# Patient Record
Sex: Male | Born: 1970 | Race: White | Hispanic: No | Smoking: Former smoker
Health system: Southern US, Community
[De-identification: ages and names within clinical notes are randomized; demographics above are authoritative.]

## PROBLEM LIST (undated history)

## (undated) DIAGNOSIS — R51 Headache: Secondary | ICD-10-CM

## (undated) DIAGNOSIS — I219 Acute myocardial infarction, unspecified: Secondary | ICD-10-CM

## (undated) DIAGNOSIS — T8859XA Other complications of anesthesia, initial encounter: Secondary | ICD-10-CM

## (undated) DIAGNOSIS — K219 Gastro-esophageal reflux disease without esophagitis: Secondary | ICD-10-CM

## (undated) DIAGNOSIS — T4145XA Adverse effect of unspecified anesthetic, initial encounter: Secondary | ICD-10-CM

## (undated) DIAGNOSIS — R519 Headache, unspecified: Secondary | ICD-10-CM

## (undated) DIAGNOSIS — J45909 Unspecified asthma, uncomplicated: Secondary | ICD-10-CM

## (undated) DIAGNOSIS — Z8719 Personal history of other diseases of the digestive system: Secondary | ICD-10-CM

## (undated) DIAGNOSIS — Z87442 Personal history of urinary calculi: Secondary | ICD-10-CM

## (undated) HISTORY — PX: CARPAL TUNNEL RELEASE: SHX101

## (undated) HISTORY — PX: CHOLECYSTECTOMY: SHX55

## (undated) HISTORY — PX: APPENDECTOMY: SHX54

## (undated) HISTORY — PX: KNEE ARTHROSCOPY: SHX127

## (undated) HISTORY — PX: ULNAR NERVE REPAIR: SHX2594

---

## 2010-10-17 HISTORY — PX: CARDIAC CATHETERIZATION: SHX172

## 2018-04-26 ENCOUNTER — Other Ambulatory Visit: Payer: Self-pay | Admitting: Neurological Surgery

## 2018-04-27 ENCOUNTER — Other Ambulatory Visit: Payer: Self-pay | Admitting: *Deleted

## 2018-05-03 ENCOUNTER — Ambulatory Visit (INDEPENDENT_AMBULATORY_CARE_PROVIDER_SITE_OTHER): Payer: Worker's Compensation | Admitting: Vascular Surgery

## 2018-05-03 ENCOUNTER — Ambulatory Visit: Payer: Self-pay | Admitting: Vascular Surgery

## 2018-05-03 ENCOUNTER — Encounter: Payer: Self-pay | Admitting: Vascular Surgery

## 2018-05-03 ENCOUNTER — Other Ambulatory Visit: Payer: Self-pay

## 2018-05-03 VITALS — BP 146/98 | HR 85 | Temp 98.1°F | Resp 16 | Ht 68.0 in | Wt 236.0 lb

## 2018-05-03 DIAGNOSIS — M5137 Other intervertebral disc degeneration, lumbosacral region: Secondary | ICD-10-CM | POA: Diagnosis not present

## 2018-05-03 NOTE — Progress Notes (Signed)
Vascular and Vein Specialist of [facility]  Patient name: Cleaven Demario MRN: [id number] DOB: Aug 04, 1971 Sex: male  REASON FOR CONSULT: Discuss anterior exposure for L5-S1 disc surgery  HPI: Rafe Mackowski is a 47 y.o. male, who is today to discuss anterior exposure for L5-S1 disc surgery plan for 06/06/2018 with Dr. Marikay Alar.  He reports that he slipped at work on September 2018 and has had severe pain in his back and left leg since this time.  He is failed physical therapy and injection therapy.  Has been recommended to undergo anterior approach for fusion.  He has had no prior back surgery.  He did have laparoscopic cholecystectomy and also had surgery for ruptured appendix requiring prolonged drainage.  No other intra-abdominal surgeries.  His medical history is significant for myocardial infarction with stenting in 2012.  Has been stable with no cardiac issues since that time  Family history: No premature cardiac difficulty.  SOCIAL HISTORY: Social History   Socioeconomic History  . Marital status: Divorced    Spouse name: Not on file  . Number of children: Not on file  . Years of education: Not on file  . Highest education level: Not on file  Occupational History  . Not on file  Social Needs  . Financial resource strain: Not on file  . Food insecurity:    Worry: Not on file    Inability: Not on file  . Transportation needs:    Medical: Not on file    Non-medical: Not on file  Tobacco Use  . Smoking status: Former Smoker    Types: Cigarettes    Last attempt to quit: 1993    Years since quitting: 26.5  . Smokeless tobacco: Former Engineer, water and Sexual Activity  . Alcohol use: Not on file  . Drug use: Not on file  . Sexual activity: Not on file  Lifestyle  . Physical activity:    Days per week: Not on file    Minutes per session: Not on file  . Stress: Not on file  Relationships  . Social connections:    Talks on  phone: Not on file    Gets together: Not on file    Attends religious service: Not on file    Active member of club or organization: Not on file    Attends meetings of clubs or organizations: Not on file    Relationship status: Not on file  . Intimate partner violence:    Fear of current or ex partner: Not on file    Emotionally abused: Not on file    Physically abused: Not on file    Forced sexual activity: Not on file  Other Topics Concern  . Not on file  Social History Narrative  . Not on file    Allergies  Allergen Reactions  . Latex     Powder in the gloves   . Naproxen     Current Outpatient Medications  Medication Sig Dispense Refill  . 40981 cholestyramine (with sugar) 4 gram oral powder    . acetaminophen (TYLENOL) 500 MG tablet Take 500 mg by mouth every 6 (six) hours as needed.    Marland Kitchen albuterol (PROAIR HFA) 108 (90 Base) MCG/ACT inhaler inhale 2 puff by inhalation route  every 4 - 6 hours as needed    . albuterol (PROVENTIL HFA;VENTOLIN HFA) 108 (90 Base) MCG/ACT inhaler     . cholestyramine (QUESTRAN) 4 GM/DOSE powder     . diclofenac sodium (  VOLTAREN) 1 % GEL Apply topically 4 (four) times daily.    Marland Kitchen. loratadine-pseudoephedrine (CLARITIN-D 12 HOUR) 5-120 MG tablet Take by mouth.    Marland Kitchen. omeprazole (PRILOSEC) 40 MG capsule omeprazole 40 mg capsule,delayed release  TK 1 C PO D    . rizatriptan (MAXALT) 10 MG tablet Take 1 tab for migraine; may repeat in 2 hours     No current facility-administered medications for this visit.     REVIEW OF SYSTEMS:  [X]  denotes positive finding, [ ]  denotes negative finding Cardiac  Comments:  Chest pain or chest pressure: x   Shortness of breath upon exertion: x   Short of breath when lying flat:    Irregular heart rhythm: x       Vascular    Pain in calf, thigh, or hip brought on by ambulation:    Pain in feet at night that wakes you up from your sleep:     Blood clot in your veins:    Leg swelling:         Pulmonary      Oxygen at home:    Productive cough:     Wheezing:         Neurologic    Sudden weakness in arms or legs:     Sudden numbness in arms or legs:     Sudden onset of difficulty speaking or slurred speech:    Temporary loss of vision in one eye:     Problems with dizziness:         Gastrointestinal    Blood in stool:     Vomited blood:         Genitourinary    Burning when urinating:     Blood in urine:        Psychiatric    Major depression:         Hematologic    Bleeding problems:    Problems with blood clotting too easily:        Skin    Rashes or ulcers:        Constitutional    Fever or chills:      PHYSICAL EXAM: Vitals:   05/03/18 0926  BP: (!) 146/98  Pulse: 85  Resp: 16  Temp: 98.1 F (36.7 C)  TempSrc: Oral  SpO2: 97%  Weight: 236 lb (107 kg)  Height: 5\' 8"  (1.727 m)    GENERAL: The patient is a well-nourished male, in no acute distress. The vital signs are documented above. CARDIOVASCULAR: Carotid arteries without bruits bilaterally.  2+ radial and 2+ dorsalis pedis pulses bilaterally PULMONARY: There is good air exchange  ABDOMEN: Soft and non-tender  MUSCULOSKELETAL: There are no major deformities or cyanosis. NEUROLOGIC: No focal weakness or paresthesias are detected. SKIN: There are no ulcers or rashes noted. PSYCHIATRIC: The patient has a normal affect.  DATA:  None  MEDICAL ISSUES: Had long discussion with the patient and his girlfriend present.  Explained my role for exposure.  Explained the mobilization of the rectus muscle, intraperitoneal contents, left ureter and arterial and venous structures overlying the spine.  Discussed attentional injury to all of these particularly discussed venous injury.  Also discussed the potential for retrograde ejaculation.  He is not morbidly obese.  Has no dense of peripheral vascular occlusive disease and is had not had extensive pelvic surgery.  I do not feel that he is at any prohibitive risk for  spine exposure.  Plan to proceed after cardiac clearance on 06/06/2018  Rosetta Posner, MD FACS Vascular and Vein Specialists of [facility] Tel [phone] Pager [phone]

## 2018-05-28 NOTE — Pre-Procedure Instructions (Signed)
Gad Burgin Ficek  05/28/2018      [id number] - [place], [place] - [address] [facility] [address] [place] [id number] Phone: [phone] Fax: [phone]    Your procedure is scheduled on June 06, 2018.  [facility] Admitting at 630 AM.  Call this number if you have problems the morning of surgery:  [phone]   Remember:  Do not eat or drink after midnight.   Take these medicines the morning of surgery with A SIP OF WATER  Tylenol-if needed Albuterol inhaler-if needed (bring inhaler with you) Xalatan eye drops Omeprazole (prilosec) Refesh eye drops-if needed  7 days prior to surgery STOP taking any diclofenac (voltaren) gel, Aspirin (unless otherwise instructed by your surgeon), Aleve, Naproxen, Ibuprofen, Motrin, Advil, Goody's, BC's, all herbal medications, fish oil, and all vitamins   Do not wear jewelry  Do not wear lotions, powders, or colognes, or deodorant.  Men may shave face and neck.  Do not bring valuables to the hospital.  [facility] is not responsible for any belongings or valuables.  Contacts, dentures or bridgework may not be worn into surgery.  Leave your suitcase in the car.  After surgery it may be brought to your room.  For patients admitted to the hospital, discharge time will be determined by your treatment team.  Patients discharged the day of surgery will not be allowed to drive home.    [facility]- Preparing For Surgery  Before surgery, you can play an important role. Because skin is not sterile, your skin needs to be as free of germs as possible. You can reduce the number of germs on your skin by washing with CHG (chlorahexidine gluconate) Soap before surgery.  CHG is an antiseptic cleaner which kills germs and bonds with the skin to continue killing germs even after washing.    Oral Hygiene is also important to reduce your risk of infection.  Remember - BRUSH YOUR TEETH  THE MORNING OF SURGERY WITH YOUR REGULAR TOOTHPASTE  Please do not use if you have an allergy to CHG or antibacterial soaps. If your skin becomes reddened/irritated stop using the CHG.  Do not shave (including legs and underarms) for at least 48 hours prior to first CHG shower. It is OK to shave your face.  Please follow these instructions carefully.   1. Shower the NIGHT BEFORE SURGERY and the MORNING OF SURGERY with CHG.   2. If you chose to wash your hair, wash your hair first as usual with your normal shampoo.  3. After you shampoo, rinse your hair and body thoroughly to remove the shampoo.  4. Use CHG as you would any other liquid soap. You can apply CHG directly to the skin and wash gently with a scrungie or a clean washcloth.   5. Apply the CHG Soap to your body ONLY FROM THE NECK DOWN.  Do not use on open wounds or open sores. Avoid contact with your eyes, ears, mouth and genitals (private parts). Wash Face and genitals (private parts)  with your normal soap.  6. Wash thoroughly, paying special attention to the area where your surgery will be performed.  7. Thoroughly rinse your body with warm water from the neck down.  8. DO NOT shower/wash with your normal soap after using and rinsing off the CHG Soap.  9. Pat yourself dry with a CLEAN TOWEL.  10. Wear  CLEAN PAJAMAS to bed the night before surgery, wear comfortable clothes the morning of surgery  11. Place CLEAN SHEETS on your bed the night of your first shower and DO NOT SLEEP WITH PETS.  Day of Surgery:  Do not apply any deodorants/lotions.  Please wear clean clothes to the hospital/surgery center.   Remember to brush your teeth WITH YOUR REGULAR TOOTHPASTE.  Please read over the following fact sheets that you were given. Pain Booklet, Coughing and Deep Breathing, MRSA Information and Surgical Site Infection Prevention

## 2018-05-29 ENCOUNTER — Encounter (HOSPITAL_COMMUNITY): Payer: Self-pay

## 2018-05-29 ENCOUNTER — Other Ambulatory Visit: Payer: Self-pay

## 2018-05-29 ENCOUNTER — Ambulatory Visit (HOSPITAL_COMMUNITY)
Admission: RE | Admit: 2018-05-29 | Discharge: 2018-05-29 | Disposition: A | Discharge status: Home / Self Care | Payer: No Typology Code available for payment source | Source: Ambulatory Visit | Attending: Neurological Surgery | Admitting: Neurological Surgery

## 2018-05-29 ENCOUNTER — Encounter (HOSPITAL_COMMUNITY)
Admission: RE | Admit: 2018-05-29 | Discharge: 2018-05-29 | Disposition: A | Discharge status: Home / Self Care | Payer: No Typology Code available for payment source | Source: Ambulatory Visit | Attending: Neurological Surgery | Admitting: Neurological Surgery

## 2018-05-29 DIAGNOSIS — Z01812 Encounter for preprocedural laboratory examination: Secondary | ICD-10-CM | POA: Insufficient documentation

## 2018-05-29 DIAGNOSIS — M4310 Spondylolisthesis, site unspecified: Secondary | ICD-10-CM

## 2018-05-29 DIAGNOSIS — Z01818 Encounter for other preprocedural examination: Secondary | ICD-10-CM | POA: Insufficient documentation

## 2018-05-29 HISTORY — DX: Adverse effect of unspecified anesthetic, initial encounter: T41.45XA

## 2018-05-29 HISTORY — DX: Acute myocardial infarction, unspecified: I21.9

## 2018-05-29 HISTORY — DX: Gastro-esophageal reflux disease without esophagitis: K21.9

## 2018-05-29 HISTORY — DX: Headache: R51

## 2018-05-29 HISTORY — DX: Unspecified asthma, uncomplicated: J45.909

## 2018-05-29 HISTORY — DX: Personal history of other diseases of the digestive system: Z87.19

## 2018-05-29 HISTORY — DX: Headache, unspecified: R51.9

## 2018-05-29 HISTORY — DX: Other complications of anesthesia, initial encounter: T88.59XA

## 2018-05-29 HISTORY — DX: Personal history of urinary calculi: Z87.442

## 2018-05-29 LAB — CBC WITH DIFFERENTIAL/PLATELET
Abs Immature Granulocytes: 0 10*3/uL (ref 0.0–0.1)
BASOS ABS: 0 10*3/uL (ref 0.0–0.1)
BASOS PCT: 0 %
Eosinophils Absolute: 0.1 10*3/uL (ref 0.0–0.7)
Eosinophils Relative: 2 %
HCT: 46.6 % (ref 39.0–52.0)
HEMOGLOBIN: 15.9 g/dL (ref 13.0–17.0)
Immature Granulocytes: 1 %
Lymphocytes Relative: 28 %
Lymphs Abs: 1.7 10*3/uL (ref 0.7–4.0)
MCH: 29.8 pg (ref 26.0–34.0)
MCHC: 34.1 g/dL (ref 30.0–36.0)
MCV: 87.4 fL (ref 78.0–100.0)
Monocytes Absolute: 0.5 10*3/uL (ref 0.1–1.0)
Monocytes Relative: 8 %
NEUTROS ABS: 3.6 10*3/uL (ref 1.7–7.7)
NEUTROS PCT: 61 %
PLATELETS: 169 10*3/uL (ref 150–400)
RBC: 5.33 MIL/uL (ref 4.22–5.81)
RDW: 13.1 % (ref 11.5–15.5)
WBC: 5.9 10*3/uL (ref 4.0–10.5)

## 2018-05-29 LAB — BASIC METABOLIC PANEL
ANION GAP: 11 (ref 5–15)
BUN: 13 mg/dL (ref 6–20)
CALCIUM: 9 mg/dL (ref 8.9–10.3)
CO2: 23 mmol/L (ref 22–32)
Chloride: 106 mmol/L (ref 98–111)
Creatinine, Ser: 1.33 mg/dL — ABNORMAL HIGH (ref 0.61–1.24)
Glucose, Bld: 83 mg/dL (ref 70–99)
Potassium: 4.2 mmol/L (ref 3.5–5.1)
SODIUM: 140 mmol/L (ref 135–145)

## 2018-05-29 LAB — TYPE AND SCREEN
ABO/RH(D): O NEG
Antibody Screen: NEGATIVE

## 2018-05-29 LAB — SURGICAL PCR SCREEN
MRSA, PCR: NEGATIVE
STAPHYLOCOCCUS AUREUS: NEGATIVE

## 2018-05-29 LAB — PROTIME-INR
INR: 1.09
PROTHROMBIN TIME: 14 s (ref 11.4–15.2)

## 2018-05-29 LAB — ABO/RH: ABO/RH(D): O NEG

## 2018-05-29 NOTE — Progress Notes (Signed)
PCP: Gardner CandleMartin Bradshaw PA @ [facility] in [place], [place] Cardiologist: Dr. Vivien PrestoAnastacio @ [facility] in [place], [place]

## 2018-05-30 NOTE — Progress Notes (Signed)
Anesthesia Chart Review:  Case:  [id number] Date/Time:  06/06/18 0815   Procedures:      ALIF - L5-S1 (N/A )     ABDOMINAL EXPOSURE (N/A )   Anesthesia type:  General   Pre-op diagnosis:  Spondylolisthesis   Location:  [facility] OR ROOM 19 / [facility] OR   Surgeon:  Tia AlertJones, David S, MD; Larina EarthlyEarly, Todd F, MD      DISCUSSION: 47 yo male former smoker for above procedure. Pertinent hx includes Asthma (exercise induced), GERD, hiatal hernia.  Listed in his history is a diagnosis of myocardial infarction. Review of cardiology records from [facility] does not indicate the pt had an MI.   He was seen by cardiology Dr. Ronnette HilaIra Friedlander on 05/04/2018 for preop clearance. Per his notes the pt was previously diagnosed with atypical chest pain and dyspnea, noncardiac in etiology.  He had a cardiac catheterization performed by Dr. Hulan Saasobert Haber on 07/26/2011 showing essentially normal coronary arteries and normal left ventricular systolic function with an ejection fraction of 60%.  Evidently the catheterization was prompted by a false positive nuclear graded exercise test.   Per Dr. Marletta LorFriedlander's clearance note "His normal heart cath in 2012 and the absence of any cardiac symptoms despite exertion greater than 4 METS places him at usual anesthetic and operative risk."  Anticipate he can proceed with surgery as planned barring acute status change.  VS: BP (!) 163/99   Pulse 83   Temp 36.8 C   Resp 20   Ht 5\' 8"  (1.727 m)   Wt 107.9 kg   SpO2 98%   BMI 36.16 kg/m   PROVIDERS: Shade FloodBradshaw, J Martin, PA-C is PCP  Ronnette HilaFriedlander, Ira, MD is Cardiologist last seen 05/04/2018 for preop clearance  LABS: Labs reviewed: Acceptable for surgery. (all labs ordered are listed, but only abnormal results are displayed)  Labs Reviewed  BASIC METABOLIC PANEL - Abnormal; Notable for the following components:      Result Value   Creatinine, Ser 1.33 (*)    All other components within normal limits  SURGICAL PCR SCREEN  CBC WITH  DIFFERENTIAL/PLATELET  PROTIME-INR  TYPE AND SCREEN  ABO/RH     IMAGES: CHEST - 2 VIEW 05/29/2018  COMPARISON:  No prior.  FINDINGS: Mediastinum and hilar structures normal. Lungs are clear. No pleural effusion pneumothorax. Heart size normal with normal pulmonary vascularity. No acute bony abnormality. Surgical clips right upper quadrant.  IMPRESSION: No acute cardiopulmonary disease.   EKG: 05/04/2018 (outside record, copy in pt chart): Sinus rhythm.  Left axis deviation.  Nonspecific T wave abnormality.   CV: Per notes from [facility] (in patient's chart) the patient had a cardiac catheterization performed by Dr. Clyde Canterburyobert Huber on 07/26/2011 showing essentially normal coronary arteries and left ventricular systolic function with an ejection fraction of 60%.  Past Medical History:  Diagnosis Date  . Asthma    excerise induced  . Complication of anesthesia    takes a lot to go under anesthesia  . GERD (gastroesophageal reflux disease)   . Headache   . History of hiatal hernia   . History of kidney stones   . Myocardial infarction [facility](HCC)     Past Surgical History:  Procedure Laterality Date  . APPENDECTOMY    . CARDIAC CATHETERIZATION  2012  . CARPAL TUNNEL RELEASE Right   . CHOLECYSTECTOMY    . KNEE ARTHROSCOPY Left   . ULNAR NERVE REPAIR Left     MEDICATIONS: . acetaminophen (TYLENOL) 500 MG tablet  .  albuterol (PROVENTIL HFA;VENTOLIN HFA) 108 (90 Base) MCG/ACT inhaler  . cholestyramine light (PREVALITE) 4 GM/DOSE powder  . diclofenac sodium (VOLTAREN) 1 % GEL  . latanoprost (XALATAN) 0.005 % ophthalmic solution  . loratadine-pseudoephedrine (CLARITIN-D 12 HOUR) 5-120 MG tablet  . Multiple Vitamins-Minerals (ICAPS PO)  . omeprazole (PRILOSEC) 40 MG capsule  . Polyvinyl Alcohol-Povidone (REFRESH OP)  . rizatriptan (MAXALT) 10 MG tablet   No current facility-administered medications for this encounter.     Zannie CoveJames Matayah Reyburn, PA-C [facility]  [facility]/Anesthesiology Phone [phone] 05/30/2018 2:38 PM

## 2018-06-05 NOTE — Anesthesia Preprocedure Evaluation (Addendum)
Anesthesia Evaluation  Patient identified by MRN, date of birth, ID band Patient awake    Reviewed: Allergy & Precautions, NPO status , Patient's Chart, lab work & pertinent test results  History of Anesthesia Complications Negative for: history of anesthetic complications  Airway Mallampati: II  TM Distance: >3 FB Neck ROM: Full    Dental no notable dental hx. (+) Dental Advisory Given   Pulmonary asthma , former smoker,    Pulmonary exam normal        Cardiovascular negative cardio ROS Normal cardiovascular exam  He had a cardiac catheterization performed by Dr. Hulan Saasobert Haber on 07/26/2011 showing essentially normal coronary arteries and normal left ventricular systolic function with an ejection fraction of 60%.  Evidently the catheterization was prompted by a false positive nuclear graded exercise test.    Neuro/Psych negative neurological ROS  negative psych ROS   GI/Hepatic Neg liver ROS, hiatal hernia, GERD  ,  Endo/Other  Morbid obesity  Renal/GU negative Renal ROS     Musculoskeletal negative musculoskeletal ROS (+)   Abdominal   Peds  Hematology negative hematology ROS (+)   Anesthesia Other Findings Day of surgery medications reviewed with the patient.  Reproductive/Obstetrics                            Anesthesia Physical Anesthesia Plan  ASA: II  Anesthesia Plan: General   Post-op Pain Management:    Induction: Intravenous  PONV Risk Score and Plan: 3 and Ondansetron, Dexamethasone and Scopolamine patch - Pre-op  Airway Management Planned: Oral ETT  Additional Equipment:   Intra-op Plan:   Post-operative Plan: Extubation in OR  Informed Consent: I have reviewed the patients History and Physical, chart, labs and discussed the procedure including the risks, benefits and alternatives for the proposed anesthesia with the patient or authorized representative who has  indicated his/her understanding and acceptance.   Dental advisory given  Plan Discussed with: CRNA and Anesthesiologist  Anesthesia Plan Comments:        Anesthesia Quick Evaluation

## 2018-06-06 ENCOUNTER — Inpatient Hospital Stay (HOSPITAL_COMMUNITY): Payer: No Typology Code available for payment source

## 2018-06-06 ENCOUNTER — Other Ambulatory Visit: Payer: Self-pay

## 2018-06-06 ENCOUNTER — Inpatient Hospital Stay (HOSPITAL_COMMUNITY): Payer: No Typology Code available for payment source | Admitting: Anesthesiology

## 2018-06-06 ENCOUNTER — Inpatient Hospital Stay (HOSPITAL_COMMUNITY)
Admission: RE | Disposition: A | Discharge status: Home / Self Care | Payer: Self-pay | Source: Ambulatory Visit | Attending: Neurological Surgery

## 2018-06-06 ENCOUNTER — Inpatient Hospital Stay (HOSPITAL_COMMUNITY)
Admission: RE | Admit: 2018-06-06 | Discharge: 2018-06-07 | DRG: 460 | Disposition: A | Discharge status: Home / Self Care | Payer: No Typology Code available for payment source | Source: Ambulatory Visit | Attending: Neurological Surgery | Admitting: Neurological Surgery

## 2018-06-06 ENCOUNTER — Encounter (HOSPITAL_COMMUNITY): Payer: Self-pay | Admitting: Urology

## 2018-06-06 ENCOUNTER — Inpatient Hospital Stay (HOSPITAL_COMMUNITY): Payer: No Typology Code available for payment source | Admitting: Vascular Surgery

## 2018-06-06 DIAGNOSIS — M5137 Other intervertebral disc degeneration, lumbosacral region: Secondary | ICD-10-CM | POA: Diagnosis present

## 2018-06-06 DIAGNOSIS — K219 Gastro-esophageal reflux disease without esophagitis: Secondary | ICD-10-CM | POA: Diagnosis present

## 2018-06-06 DIAGNOSIS — Z419 Encounter for procedure for purposes other than remedying health state, unspecified: Secondary | ICD-10-CM

## 2018-06-06 DIAGNOSIS — M4317 Spondylolisthesis, lumbosacral region: Secondary | ICD-10-CM | POA: Diagnosis present

## 2018-06-06 DIAGNOSIS — I252 Old myocardial infarction: Secondary | ICD-10-CM

## 2018-06-06 DIAGNOSIS — Z87891 Personal history of nicotine dependence: Secondary | ICD-10-CM | POA: Diagnosis not present

## 2018-06-06 DIAGNOSIS — Z981 Arthrodesis status: Secondary | ICD-10-CM

## 2018-06-06 DIAGNOSIS — J45909 Unspecified asthma, uncomplicated: Secondary | ICD-10-CM | POA: Diagnosis present

## 2018-06-06 DIAGNOSIS — M79605 Pain in left leg: Secondary | ICD-10-CM | POA: Diagnosis present

## 2018-06-06 HISTORY — PX: ANTERIOR LUMBAR FUSION: SHX1170

## 2018-06-06 HISTORY — PX: ABDOMINAL EXPOSURE: SHX5708

## 2018-06-06 SURGERY — ANTERIOR LUMBAR FUSION 1 LEVEL
Anesthesia: General

## 2018-06-06 MED ORDER — LATANOPROST 0.005 % OP SOLN
1.0000 [drp] | Freq: Every day | OPHTHALMIC | Status: DC
  Filled 2018-06-06: qty 2.5

## 2018-06-06 MED ORDER — LACTATED RINGERS IV SOLN
INTRAVENOUS | Status: DC | PRN
  Administered 2018-06-06 (×2): via INTRAVENOUS

## 2018-06-06 MED ORDER — 0.9 % SODIUM CHLORIDE (POUR BTL) OPTIME
TOPICAL | Status: DC | PRN
  Administered 2018-06-06 (×2): 1000 mL

## 2018-06-06 MED ORDER — ONDANSETRON HCL 4 MG/2ML IJ SOLN: 4.0000 mg | Freq: Four times a day (QID) | INTRAMUSCULAR | Status: DC | PRN

## 2018-06-06 MED ORDER — ROCURONIUM BROMIDE 50 MG/5ML IV SOSY
PREFILLED_SYRINGE | INTRAVENOUS | Status: AC
  Filled 2018-06-06: qty 10

## 2018-06-06 MED ORDER — HYDROMORPHONE HCL 1 MG/ML IJ SOLN
0.2500 mg | INTRAMUSCULAR | Status: DC | PRN
  Administered 2018-06-06 (×2): 0.5 mg via INTRAVENOUS

## 2018-06-06 MED ORDER — KETAMINE HCL 10 MG/ML IJ SOLN
INTRAMUSCULAR | Status: DC | PRN
  Administered 2018-06-06 (×3): 10 mg via INTRAVENOUS

## 2018-06-06 MED ORDER — SCOPOLAMINE 1 MG/3DAYS TD PT72
1.0000 | MEDICATED_PATCH | TRANSDERMAL | Status: DC
  Administered 2018-06-06: 1.5 mg via TRANSDERMAL

## 2018-06-06 MED ORDER — CHLORHEXIDINE GLUCONATE CLOTH 2 % EX PADS: 6.0000 | MEDICATED_PAD | Freq: Once | CUTANEOUS | Status: DC

## 2018-06-06 MED ORDER — DEXAMETHASONE SODIUM PHOSPHATE 10 MG/ML IJ SOLN
INTRAMUSCULAR | Status: DC | PRN
  Administered 2018-06-06: 10 mg via INTRAVENOUS

## 2018-06-06 MED ORDER — ROCURONIUM BROMIDE 10 MG/ML (PF) SYRINGE
PREFILLED_SYRINGE | INTRAVENOUS | Status: DC | PRN
  Administered 2018-06-06: 20 mg via INTRAVENOUS
  Administered 2018-06-06: 10 mg via INTRAVENOUS
  Administered 2018-06-06: 50 mg via INTRAVENOUS
  Administered 2018-06-06: 20 mg via INTRAVENOUS

## 2018-06-06 MED ORDER — PROPOFOL 10 MG/ML IV BOLUS
INTRAVENOUS | Status: DC | PRN
  Administered 2018-06-06: 200 mg via INTRAVENOUS

## 2018-06-06 MED ORDER — METHOCARBAMOL 1000 MG/10ML IJ SOLN
500.0000 mg | Freq: Four times a day (QID) | INTRAVENOUS | Status: DC | PRN
  Filled 2018-06-06: qty 5

## 2018-06-06 MED ORDER — SCOPOLAMINE 1 MG/3DAYS TD PT72
MEDICATED_PATCH | TRANSDERMAL | Status: AC
  Administered 2018-06-06: 1.5 mg via TRANSDERMAL
  Filled 2018-06-06: qty 1

## 2018-06-06 MED ORDER — ONDANSETRON HCL 4 MG/2ML IJ SOLN
INTRAMUSCULAR | Status: AC
  Filled 2018-06-06: qty 2

## 2018-06-06 MED ORDER — THROMBIN 20000 UNITS EX SOLR
CUTANEOUS | Status: DC | PRN
  Administered 2018-06-06: 09:00:00 via TOPICAL

## 2018-06-06 MED ORDER — FENTANYL CITRATE (PF) 250 MCG/5ML IJ SOLN
INTRAMUSCULAR | Status: AC
  Filled 2018-06-06: qty 5

## 2018-06-06 MED ORDER — LIDOCAINE 2% (20 MG/ML) 5 ML SYRINGE
INTRAMUSCULAR | Status: DC | PRN
  Administered 2018-06-06: 80 mg via INTRAVENOUS

## 2018-06-06 MED ORDER — MIDAZOLAM HCL 2 MG/2ML IJ SOLN
INTRAMUSCULAR | Status: AC
  Filled 2018-06-06: qty 2

## 2018-06-06 MED ORDER — MENTHOL 3 MG MT LOZG: 1.0000 | LOZENGE | OROMUCOSAL | Status: DC | PRN

## 2018-06-06 MED ORDER — SENNA 8.6 MG PO TABS
1.0000 | ORAL_TABLET | Freq: Two times a day (BID) | ORAL | Status: DC
  Administered 2018-06-06 – 2018-06-07 (×3): 8.6 mg via ORAL
  Filled 2018-06-06 (×3): qty 1

## 2018-06-06 MED ORDER — DEXAMETHASONE SODIUM PHOSPHATE 10 MG/ML IJ SOLN
INTRAMUSCULAR | Status: AC
  Filled 2018-06-06: qty 1

## 2018-06-06 MED ORDER — KETAMINE HCL 50 MG/5ML IJ SOSY
PREFILLED_SYRINGE | INTRAMUSCULAR | Status: AC
  Filled 2018-06-06: qty 5

## 2018-06-06 MED ORDER — POTASSIUM CHLORIDE IN NACL 20-0.9 MEQ/L-% IV SOLN: INTRAVENOUS | Status: DC

## 2018-06-06 MED ORDER — SUCCINYLCHOLINE CHLORIDE 200 MG/10ML IV SOSY
PREFILLED_SYRINGE | INTRAVENOUS | Status: DC | PRN
  Administered 2018-06-06: 140 mg via INTRAVENOUS

## 2018-06-06 MED ORDER — MIDAZOLAM HCL 2 MG/2ML IJ SOLN
INTRAMUSCULAR | Status: DC | PRN
  Administered 2018-06-06: 2 mg via INTRAVENOUS

## 2018-06-06 MED ORDER — SODIUM CHLORIDE 0.9 % IV SOLN: 250.0000 mL | INTRAVENOUS | Status: DC

## 2018-06-06 MED ORDER — PROPOFOL 10 MG/ML IV BOLUS
INTRAVENOUS | Status: AC
  Filled 2018-06-06: qty 20

## 2018-06-06 MED ORDER — SODIUM CHLORIDE 0.9% FLUSH: 3.0000 mL | Freq: Two times a day (BID) | INTRAVENOUS | Status: DC

## 2018-06-06 MED ORDER — OXYCODONE HCL 5 MG PO TABS
ORAL_TABLET | ORAL | Status: AC
  Administered 2018-06-06: 5 mg via ORAL
  Filled 2018-06-06: qty 1

## 2018-06-06 MED ORDER — PROMETHAZINE HCL 25 MG/ML IJ SOLN: 6.2500 mg | INTRAMUSCULAR | Status: DC | PRN

## 2018-06-06 MED ORDER — SODIUM CHLORIDE 0.9% FLUSH: 3.0000 mL | INTRAVENOUS | Status: DC | PRN

## 2018-06-06 MED ORDER — SUGAMMADEX SODIUM 200 MG/2ML IV SOLN
INTRAVENOUS | Status: DC | PRN
  Administered 2018-06-06: 200 mg via INTRAVENOUS

## 2018-06-06 MED ORDER — HYDROMORPHONE HCL 1 MG/ML IJ SOLN
INTRAMUSCULAR | Status: AC
  Administered 2018-06-06: 0.5 mg via INTRAVENOUS
  Filled 2018-06-06: qty 1

## 2018-06-06 MED ORDER — FENTANYL CITRATE (PF) 100 MCG/2ML IJ SOLN
INTRAMUSCULAR | Status: DC | PRN
  Administered 2018-06-06 (×2): 50 ug via INTRAVENOUS
  Administered 2018-06-06: 100 ug via INTRAVENOUS
  Administered 2018-06-06: 50 ug via INTRAVENOUS

## 2018-06-06 MED ORDER — DEXAMETHASONE SODIUM PHOSPHATE 10 MG/ML IJ SOLN
10.0000 mg | INTRAMUSCULAR | Status: AC
  Administered 2018-06-06: 10 mg via INTRAVENOUS

## 2018-06-06 MED ORDER — LACTATED RINGERS IV SOLN
INTRAVENOUS | Status: DC | PRN
  Administered 2018-06-06: 08:00:00 via INTRAVENOUS

## 2018-06-06 MED ORDER — SODIUM CHLORIDE 0.9 % IV SOLN
INTRAVENOUS | Status: DC | PRN
  Administered 2018-06-06: 10 ug/min via INTRAVENOUS
  Administered 2018-06-06: 25 ug/min via INTRAVENOUS

## 2018-06-06 MED ORDER — ALBUTEROL SULFATE (2.5 MG/3ML) 0.083% IN NEBU: 3.0000 mL | INHALATION_SOLUTION | Freq: Four times a day (QID) | RESPIRATORY_TRACT | Status: DC | PRN

## 2018-06-06 MED ORDER — CEFAZOLIN SODIUM-DEXTROSE 2-4 GM/100ML-% IV SOLN
2.0000 g | Freq: Three times a day (TID) | INTRAVENOUS | Status: AC
  Administered 2018-06-06 (×2): 2 g via INTRAVENOUS
  Filled 2018-06-06 (×2): qty 100

## 2018-06-06 MED ORDER — SODIUM CHLORIDE 0.9 % IV SOLN
INTRAVENOUS | Status: DC | PRN
  Administered 2018-06-06: 09:00:00

## 2018-06-06 MED ORDER — ONDANSETRON HCL 4 MG/2ML IJ SOLN
INTRAMUSCULAR | Status: DC | PRN
  Administered 2018-06-06: 4 mg via INTRAVENOUS

## 2018-06-06 MED ORDER — PHENOL 1.4 % MT LIQD: 1.0000 | OROMUCOSAL | Status: DC | PRN

## 2018-06-06 MED ORDER — HYDROMORPHONE HCL 1 MG/ML IJ SOLN
1.0000 mg | INTRAMUSCULAR | Status: DC | PRN
  Administered 2018-06-06 (×2): 1 mg via INTRAVENOUS
  Filled 2018-06-06 (×2): qty 1

## 2018-06-06 MED ORDER — CHLORHEXIDINE GLUCONATE 4 % EX LIQD: 60.0000 mL | Freq: Once | CUTANEOUS | Status: DC

## 2018-06-06 MED ORDER — CEFAZOLIN SODIUM-DEXTROSE 2-4 GM/100ML-% IV SOLN
INTRAVENOUS | Status: AC
  Filled 2018-06-06: qty 100

## 2018-06-06 MED ORDER — THROMBIN (RECOMBINANT) 20000 UNITS EX SOLR
CUTANEOUS | Status: AC
  Filled 2018-06-06: qty 20000

## 2018-06-06 MED ORDER — METHOCARBAMOL 500 MG PO TABS
ORAL_TABLET | ORAL | Status: AC
  Filled 2018-06-06: qty 1

## 2018-06-06 MED ORDER — CHOLESTYRAMINE LIGHT 4 G PO PACK
4.0000 g | PACK | Freq: Every day | ORAL | Status: DC
  Administered 2018-06-07: 4 g via ORAL
  Filled 2018-06-06 (×2): qty 1

## 2018-06-06 MED ORDER — ACETAMINOPHEN 650 MG RE SUPP: 650.0000 mg | RECTAL | Status: DC | PRN

## 2018-06-06 MED ORDER — OXYCODONE HCL 5 MG PO TABS
5.0000 mg | ORAL_TABLET | ORAL | Status: DC | PRN
  Administered 2018-06-06: 5 mg via ORAL

## 2018-06-06 MED ORDER — ROCURONIUM BROMIDE 50 MG/5ML IV SOSY
PREFILLED_SYRINGE | INTRAVENOUS | Status: AC
  Filled 2018-06-06: qty 5

## 2018-06-06 MED ORDER — ACETAMINOPHEN 325 MG PO TABS
650.0000 mg | ORAL_TABLET | ORAL | Status: DC | PRN
  Administered 2018-06-07 (×2): 650 mg via ORAL
  Filled 2018-06-06 (×2): qty 2

## 2018-06-06 MED ORDER — CEFAZOLIN SODIUM-DEXTROSE 2-4 GM/100ML-% IV SOLN
2.0000 g | INTRAVENOUS | Status: AC
  Administered 2018-06-06: 2 g via INTRAVENOUS

## 2018-06-06 MED ORDER — LIDOCAINE 2% (20 MG/ML) 5 ML SYRINGE
INTRAMUSCULAR | Status: AC
  Filled 2018-06-06: qty 5

## 2018-06-06 MED ORDER — METHOCARBAMOL 500 MG PO TABS
500.0000 mg | ORAL_TABLET | Freq: Four times a day (QID) | ORAL | Status: DC | PRN
  Administered 2018-06-06 – 2018-06-07 (×4): 500 mg via ORAL
  Filled 2018-06-06 (×3): qty 1

## 2018-06-06 MED ORDER — HEMOSTATIC AGENTS (NO CHARGE) OPTIME
TOPICAL | Status: DC | PRN
  Administered 2018-06-06: 1 via TOPICAL

## 2018-06-06 MED ORDER — OXYCODONE HCL 5 MG PO TABS
10.0000 mg | ORAL_TABLET | ORAL | Status: DC | PRN
  Administered 2018-06-06 – 2018-06-07 (×4): 10 mg via ORAL
  Filled 2018-06-06 (×4): qty 2

## 2018-06-06 MED ORDER — ONDANSETRON HCL 4 MG PO TABS: 4.0000 mg | ORAL_TABLET | Freq: Four times a day (QID) | ORAL | Status: DC | PRN

## 2018-06-06 SURGICAL SUPPLY — 74 items
ANCHOR LUMBAR MIS 30 (Anchor) ×6 IMPLANT
APPLIER CLIP 11 MED OPEN (CLIP) ×2
BAG DECANTER FOR FLEXI CONT (MISCELLANEOUS) ×2 IMPLANT
BONE VIVIGEN FORMABLE 5.4CC (Bone Implant) ×4 IMPLANT
BUR BARREL STRAIGHT FLUTE 4.0 (BURR) IMPLANT
BUR MATCHSTICK NEURO 3.0 LAGG (BURR) IMPLANT
CANISTER SUCT 3000ML PPV (MISCELLANEOUS) ×2 IMPLANT
CARTRIDGE OIL MAESTRO DRILL (MISCELLANEOUS) IMPLANT
CLIP APPLIE 11 MED OPEN (CLIP) ×1 IMPLANT
CLIP LIGATING EXTRA MED SLVR (CLIP) ×2 IMPLANT
CLIP LIGATING EXTRA SM BLUE (MISCELLANEOUS) ×2 IMPLANT
DERMABOND ADVANCED (GAUZE/BANDAGES/DRESSINGS) ×1
DERMABOND ADVANCED .7 DNX12 (GAUZE/BANDAGES/DRESSINGS) ×1 IMPLANT
DIFFUSER DRILL AIR PNEUMATIC (MISCELLANEOUS) IMPLANT
DRAPE C-ARM 42X72 X-RAY (DRAPES) ×4 IMPLANT
DRAPE LAPAROTOMY 100X72X124 (DRAPES) ×2 IMPLANT
DURAPREP 26ML APPLICATOR (WOUND CARE) ×2 IMPLANT
ELECT BLADE 4.0 EZ CLEAN MEGAD (MISCELLANEOUS) ×2
ELECT REM PT RETURN 9FT ADLT (ELECTROSURGICAL) ×2
ELECTRODE BLDE 4.0 EZ CLN MEGD (MISCELLANEOUS) ×1 IMPLANT
ELECTRODE REM PT RTRN 9FT ADLT (ELECTROSURGICAL) ×1 IMPLANT
GAUZE 4X4 16PLY RFD (DISPOSABLE) IMPLANT
GAUZE SPONGE 4X4 12PLY STRL (GAUZE/BANDAGES/DRESSINGS) ×2 IMPLANT
GLOVE BIO SURGEON STRL SZ8 (GLOVE) ×4 IMPLANT
GLOVE BIOGEL PI IND STRL 7.0 (GLOVE) ×1 IMPLANT
GLOVE BIOGEL PI INDICATOR 7.0 (GLOVE) ×1
GLOVE SS BIOGEL STRL SZ 7.5 (GLOVE) ×2 IMPLANT
GLOVE SS N UNI LF 7.5 STRL (GLOVE) ×4 IMPLANT
GLOVE SUPERSENSE BIOGEL SZ 7.5 (GLOVE) ×2
GOWN STRL REUS W/ TWL LRG LVL3 (GOWN DISPOSABLE) ×2 IMPLANT
GOWN STRL REUS W/ TWL XL LVL3 (GOWN DISPOSABLE) ×2 IMPLANT
GOWN STRL REUS W/TWL 2XL LVL3 (GOWN DISPOSABLE) IMPLANT
GOWN STRL REUS W/TWL LRG LVL3 (GOWN DISPOSABLE) ×2
GOWN STRL REUS W/TWL XL LVL3 (GOWN DISPOSABLE) ×2
HEMOSTAT POWDER KIT SURGIFOAM (HEMOSTASIS) IMPLANT
INSERT FOGARTY 61MM (MISCELLANEOUS) IMPLANT
INSERT FOGARTY SM (MISCELLANEOUS) IMPLANT
KIT BASIN OR (CUSTOM PROCEDURE TRAY) ×2 IMPLANT
KIT TURNOVER KIT B (KITS) ×2 IMPLANT
LOOP VESSEL MAXI BLUE (MISCELLANEOUS) IMPLANT
LOOP VESSEL MINI RED (MISCELLANEOUS) IMPLANT
NEEDLE SPNL 18GX3.5 QUINCKE PK (NEEDLE) ×2 IMPLANT
NS IRRIG 1000ML POUR BTL (IV SOLUTION) ×4 IMPLANT
OIL CARTRIDGE MAESTRO DRILL (MISCELLANEOUS)
PACK LAMINECTOMY NEURO (CUSTOM PROCEDURE TRAY) ×2 IMPLANT
PAD ARMBOARD 7.5X6 YLW CONV (MISCELLANEOUS) ×4 IMPLANT
SPACER INDEP MIS 29X39 17 8D (Spacer) ×2 IMPLANT
SPONGE INTESTINAL PEANUT (DISPOSABLE) ×6 IMPLANT
SPONGE LAP 18X18 X RAY DECT (DISPOSABLE) ×2 IMPLANT
SPONGE LAP 4X18 RFD (DISPOSABLE) IMPLANT
SPONGE SURGIFOAM ABS GEL 100 (HEMOSTASIS) ×2 IMPLANT
STAPLER VISISTAT 35W (STAPLE) IMPLANT
SURGIFLO W/THROMBIN 8M KIT (HEMOSTASIS) ×2 IMPLANT
SUT PROLENE 4 0 RB 1 (SUTURE)
SUT PROLENE 4-0 RB1 .5 CRCL 36 (SUTURE) IMPLANT
SUT PROLENE 5 0 CC1 (SUTURE) ×2 IMPLANT
SUT PROLENE 6 0 C 1 30 (SUTURE) ×2 IMPLANT
SUT PROLENE 6 0 CC (SUTURE) IMPLANT
SUT SILK 0 TIES 10X30 (SUTURE) ×2 IMPLANT
SUT SILK 2 0 TIES 10X30 (SUTURE) IMPLANT
SUT SILK 2 0SH CR/8 30 (SUTURE) IMPLANT
SUT SILK 3 0 TIES 10X30 (SUTURE) IMPLANT
SUT SILK 3 0SH CR/8 30 (SUTURE) IMPLANT
SUT VIC AB 0 CT1 18XCR BRD8 (SUTURE) ×2 IMPLANT
SUT VIC AB 0 CT1 27 (SUTURE) ×1
SUT VIC AB 0 CT1 27XBRD ANBCTR (SUTURE) ×1 IMPLANT
SUT VIC AB 0 CT1 8-18 (SUTURE) ×2
SUT VIC AB 2-0 CP2 18 (SUTURE) ×2 IMPLANT
SUT VIC AB 3-0 SH 8-18 (SUTURE) ×4 IMPLANT
SUT VICRYL 4-0 PS2 18IN ABS (SUTURE) IMPLANT
TOWEL GREEN STERILE (TOWEL DISPOSABLE) ×2 IMPLANT
TOWEL GREEN STERILE FF (TOWEL DISPOSABLE) ×2 IMPLANT
TRAY FOLEY MTR SLVR 16FR STAT (SET/KITS/TRAYS/PACK) ×2 IMPLANT
WATER STERILE IRR 1000ML POUR (IV SOLUTION) ×2 IMPLANT

## 2018-06-06 NOTE — Anesthesia Postprocedure Evaluation (Signed)
Anesthesia Post Note  Patient: Edward Good  Procedure(s) Performed: Anterior Lumbar Interbody Fusion- Lumbar five sacral one (N/A ) ABDOMINAL EXPOSURE (N/A )     Patient location during evaluation: PACU Anesthesia Type: General Level of consciousness: sedated Pain management: pain level controlled Vital Signs Assessment: post-procedure vital signs reviewed and stable Respiratory status: spontaneous breathing and respiratory function stable Cardiovascular status: stable Postop Assessment: no apparent nausea or vomiting Anesthetic complications: no    Last Vitals:  Vitals:   06/06/18 1225 06/06/18 1240  BP: (!) 149/99 (!) 155/85  Pulse: 72 69  Resp: 10 16  Temp:    SpO2: 99% (!) 89%    Last Pain:  Vitals:   06/06/18 1225  TempSrc:   PainSc: 8                  Bayden Gil DANIEL

## 2018-06-06 NOTE — H&P (Signed)
Subjective: Patient is a 47 y.o. male admitted for back and LLE pain. Onset of symptoms was several months ago, gradually worsening since that time.  The pain is rated severe, and is located at the across the lower back and radiates to LLE. The pain is described as aching and occurs all day. The symptoms have been progressive. Symptoms are exacerbated by exercise. 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 Asthma    excerise induced  . Complication of anesthesia    takes a lot to go under anesthesia  . GERD (gastroesophageal reflux disease)   . Headache   . History of hiatal hernia   . History of kidney stones   . Myocardial infarction [facility](HCC)     Past Surgical History:  Procedure Laterality Date  . APPENDECTOMY    . CARDIAC CATHETERIZATION  2012  . CARPAL TUNNEL RELEASE Right   . CHOLECYSTECTOMY    . KNEE ARTHROSCOPY Left   . ULNAR NERVE REPAIR Left     Prior to Admission medications   Medication Sig Start Date End Date Taking? Authorizing Provider  acetaminophen (TYLENOL) 500 MG tablet Take 1,000 mg by mouth 2 (two) times daily as needed for moderate pain.    Yes [provider]  cholestyramine light (PREVALITE) 4 GM/DOSE powder Take 4 g by mouth daily.   Yes [provider]  diclofenac sodium (VOLTAREN) 1 % GEL Apply 1 application topically 4 (four) times daily as needed (muscle / joint pain).    Yes [provider]  latanoprost (XALATAN) 0.005 % ophthalmic solution Place 1 drop into both eyes at bedtime.   Yes [provider]  Multiple Vitamins-Minerals (ICAPS PO) Take 1 tablet by mouth daily.   Yes [provider]  omeprazole (PRILOSEC) 40 MG capsule Take 40 mg by mouth once daily 04/11/16  Yes [provider]  rizatriptan (MAXALT) 10 MG tablet Take 10 mg by mouth as needed for migraine. May repeat in 2 hours if needed   Yes [provider]  albuterol (PROVENTIL  HFA;VENTOLIN HFA) 108 (90 Base) MCG/ACT inhaler Inhale 2 puffs into the lungs every 6 (six) hours as needed for wheezing or shortness of breath.    [provider]  loratadine-pseudoephedrine (CLARITIN-D 12 HOUR) 5-120 MG tablet Take 1 tablet by mouth daily as needed for allergies.     [provider]  Polyvinyl Alcohol-Povidone (REFRESH OP) Place 1 drop into both eyes daily as needed (burning).    [provider]   Allergies  Allergen Reactions  . Codeine Itching  . Cyclobenzaprine Other (See Comments)    Hallucinations   . Naprosyn [Naproxen]     Tears up stomach  . Soap & Cleansers Itching  . Tea Itching  . Chlorhexidine Gluconate Rash    RASH  . Latex Rash    Powder in the gloves, not latex     Social History   Tobacco Use  . Smoking status: Former Smoker    Types: Cigarettes    Last attempt to quit: 1993    Years since quitting: 26.6  . Smokeless tobacco: Former Engineer, waterUser  Substance Use Topics  . Alcohol use: Never    Frequency: Never    History reviewed. No pertinent family history.   Review of Systems  Positive ROS: neg  All other systems have been reviewed and were otherwise negative with the exception of those mentioned in the HPI and as above.  Objective:  Vital signs in last 24 hours: Temp:  [98.2 F (36.8 C)] 98.2 F (36.8 C) (08/21 0614) Pulse Rate:  [76] 76 (08/21 0614) Resp:  [20] 20 (08/21 0614) BP: (162-177)/(92-102) 162/92 (08/21 0629) SpO2:  [98 %] 98 % (08/21 0614) Weight:  [103.4 kg] 103.4 kg (08/21 0614)  General Appearance: Alert, cooperative, no distress, appears stated age Head: Normocephalic, without obvious abnormality, atraumatic Eyes: PERRL, conjunctiva/corneas clear, EOM's intact    Neck: Supple, symmetrical, trachea midline Back: Symmetric, no curvature, ROM normal, no CVA tenderness Lungs:  respirations unlabored Heart: Regular rate and rhythm Abdomen: Soft, non-tender Extremities: Extremities normal,  atraumatic, no cyanosis or edema Pulses: 2+ and symmetric all extremities Skin: Skin color, texture, turgor normal, no rashes or lesions  NEUROLOGIC:   Mental status: Alert and oriented x4,  no aphasia, good attention span, fund of knowledge, and memory Motor Exam - grossly normal Sensory Exam - grossly normal Reflexes: 1+ Coordination - grossly normal Gait - not tested Balance - grossly normal Cranial Nerves: I: smell Not tested  II: visual acuity  OS: nl    OD: nl  II: visual fields Full to confrontation  II: pupils Equal, round, reactive to light  III,VII: ptosis None  III,IV,VI: extraocular muscles  Full ROM  V: mastication Normal  V: facial light touch sensation  Normal  V,VII: corneal reflex  Present  VII: facial muscle function - upper  Normal  VII: facial muscle function - lower Normal  VIII: hearing Not tested  IX: soft palate elevation  Normal  IX,X: gag reflex Present  XI: trapezius strength  5/5  XI: sternocleidomastoid strength 5/5  XI: neck flexion strength  5/5  XII: tongue strength  Normal    Data Review Lab Results  Component Value Date   WBC 5.9 05/29/2018   HGB 15.9 05/29/2018   HCT 46.6 05/29/2018   MCV 87.4 05/29/2018   PLT 169 05/29/2018   Lab Results  Component Value Date   NA 140 05/29/2018   K 4.2 05/29/2018   CL 106 05/29/2018   CO2 23 05/29/2018   BUN 13 05/29/2018   CREATININE 1.33 (H) 05/29/2018   GLUCOSE 83 05/29/2018   Lab Results  Component Value Date   INR 1.09 05/29/2018    Assessment/Plan:  Estimated body mass index is 34.67 kg/m as calculated from the following:   Height as of 05/29/18: 5\' 8"  (1.727 m).   Weight as of this encounter: 103.4 kg. Patient admitted for ALIF L5-S1. Patient has failed a reasonable attempt at conservative therapy.  I explained the condition and procedure to the patient and answered any questions.  Patient wishes to proceed with procedure as planned. Understands risks/ benefits and typical  outcomes of procedure.   Tedd Cottrill S 06/06/2018 8:04 AM

## 2018-06-06 NOTE — Transfer of Care (Signed)
Immediate Anesthesia Transfer of Care Note  Patient: Kyren Burgin Julson  Procedure(s) Performed: Anterior Lumbar Interbody Fusion- Lumbar five sacral one (N/A ) ABDOMINAL EXPOSURE (N/A )  Patient Location: PACU  Anesthesia Type:General  Level of Consciousness: oriented, drowsy, patient cooperative and responds to stimulation  Airway & Oxygen Therapy: Patient Spontanous Breathing and Patient connected to nasal cannula oxygen  Post-op Assessment: Report given to RN, Post -op Vital signs reviewed and stable and Patient moving all extremities X 4  Post vital signs: Reviewed and stable  Last Vitals:  Vitals Value Taken Time  BP 151/95 06/06/2018 11:37 AM  Temp    Pulse 73 06/06/2018 11:40 AM  Resp 6 06/06/2018 11:40 AM  SpO2 97 % 06/06/2018 11:40 AM  Vitals shown include unvalidated device data.  Last Pain:  Vitals:   06/06/18 0623  TempSrc:   PainSc: 7          Complications: No apparent anesthesia complications

## 2018-06-06 NOTE — Evaluation (Signed)
Physical Therapy Evaluation Patient Details Name: Edward Good MRN: [id number] DOB: 05/20/1971 Today's Date: 06/06/2018   History of Present Illness  Pt is a 47 y/o male s/p L5-S1 ALIF. PMH includes asthma and MI.   Clinical Impression  Patient is s/p above surgery resulting in the deficits listed below (see PT Problem List). Pt very guarded throughout gait secondary to pain and had some dizziness which limited mobility tolerance. REquired min guard A for mobility this session. Educated about walking program and back precautions. Pt's brace ill fitting and would recommend larger lumbar corset if possible. Patient will benefit from skilled PT to increase their independence and safety with mobility (while adhering to their precautions) to allow discharge to the venue listed below.     Follow Up Recommendations No PT follow up;Supervision for mobility/OOB    Equipment Recommendations  None recommended by PT    Recommendations for Other Services       Precautions / Restrictions Precautions Precautions: Back Precaution Booklet Issued: Yes (comment) Precaution Comments: Reviewed back precautions with pt.  Required Braces or Orthoses: Spinal Brace Spinal Brace: Lumbar corset;Applied in standing position Restrictions Weight Bearing Restrictions: No      Mobility  Bed Mobility Overal bed mobility: Needs Assistance Bed Mobility: Rolling;Sidelying to Sit;Sit to Sidelying Rolling: Supervision Sidelying to sit: Min assist     Sit to sidelying: Min assist General bed mobility comments: Min A for trunk elevation to come up to sitting. Min A for LE assist to return to sidelying. Verbal cues for sequencing using log roll technique.   Transfers Overall transfer level: Needs assistance Equipment used: None Transfers: Sit to/from Stand Sit to Stand: Min guard         General transfer comment: Min guard for steadying assist. Verbal cues to power through LEs.    Ambulation/Gait Ambulation/Gait assistance: Min guard Gait Distance (Feet): 175 Feet Assistive device: IV Pole Gait Pattern/deviations: Step-through pattern;Decreased stride length Gait velocity: Decreased   General Gait Details: Slow, very guarded gait. Pt with some dizziness which limited ambulation distance. Mild unsteadiness noted requiring min guard for steadying. Educated about generalized walking program to perform at home and to use cane if feeling unsteady.   Stairs            Wheelchair Mobility    Modified Rankin (Stroke Patients Only)       Balance Overall balance assessment: Needs assistance Sitting-balance support: No upper extremity supported;Feet supported Sitting balance-Leahy Scale: Good     Standing balance support: No upper extremity supported;During functional activity;Single extremity supported Standing balance-Leahy Scale: Fair Standing balance comment: Able to maintain static standing without UE support.                              Pertinent Vitals/Pain Pain Assessment: Faces Faces Pain Scale: Hurts whole lot Pain Location: back  Pain Descriptors / Indicators: Aching;Operative site guarding Pain Intervention(s): Limited activity within patient's tolerance;Monitored during session;Repositioned    Home Living Family/patient expects to be discharged to:: Private residence Living Arrangements: Spouse/significant other;Children Available Help at Discharge: Family;Available 24 hours/day Type of Home: House Home Access: Ramped entrance     Home Layout: One level Home Equipment: Cane - single point;Bedside commode      Prior Function Level of Independence: Independent               Hand Dominance        Extremity/Trunk Assessment   Upper  Extremity Assessment Upper Extremity Assessment: Defer to OT evaluation    Lower Extremity Assessment Lower Extremity Assessment: Generalized weakness    Cervical / Trunk  Assessment Cervical / Trunk Assessment: Other exceptions Cervical / Trunk Exceptions: s/p lumbar surgery   Communication   Communication: No difficulties  Cognition Arousal/Alertness: Awake/alert Behavior During Therapy: WFL for tasks assessed/performed Overall Cognitive Status: Within Functional Limits for tasks assessed                                        General Comments      Exercises     Assessment/Plan    PT Assessment Patient needs continued PT services  PT Problem List Decreased strength;Decreased balance;Decreased activity tolerance;Decreased mobility;Decreased knowledge of precautions;Decreased knowledge of use of DME;Pain       PT Treatment Interventions DME instruction;Gait training;Therapeutic exercise;Therapeutic activities;Functional mobility training;Balance training;Patient/family education    PT Goals (Current goals can be found in the Care Plan section)  Acute Rehab PT Goals Patient Stated Goal: "to decrease pain"  PT Goal Formulation: With patient Time For Goal Achievement: 06/20/18 Potential to Achieve Goals: Good    Frequency Min 5X/week   Barriers to discharge        Co-evaluation               AM-PAC PT "6 Clicks" Daily Activity  Outcome Measure Difficulty turning over in bed (including adjusting bedclothes, sheets and blankets)?: A Lot Difficulty moving from lying on back to sitting on the side of the bed? : Unable Difficulty sitting down on and standing up from a chair with arms (e.g., wheelchair, bedside commode, etc,.)?: Unable Help needed moving to and from a bed to chair (including a wheelchair)?: A Little Help needed walking in hospital room?: A Little Help needed climbing 3-5 steps with a railing? : A Lot 6 Click Score: 12    End of Session Equipment Utilized During Treatment: Gait belt;Back brace Activity Tolerance: Patient limited by pain;Treatment limited secondary to medical complications  (Comment)(dizziness ) Patient left: in bed;with call bell/phone within reach Nurse Communication: Mobility status PT Visit Diagnosis: Other abnormalities of gait and mobility (R26.89);Pain Pain - part of body: (back )    Time: [id number] PT Time Calculation (min) (ACUTE ONLY): 23 min   Charges:   PT Evaluation $PT Eval Low Complexity: 1 Low PT Treatments $Gait Training: 8-22 mins        Gladys DammeBrittany Thailyn Khalid, PT, DPT  Acute Rehabilitation Services  Pager: [phone]   Lehman PromBrittany S Chrsitopher Wik 06/06/2018, 6:26 PM

## 2018-06-06 NOTE — Op Note (Signed)
    OPERATIVE REPORT  DATE OF SURGERY: 06/06/2018  PATIENT: Edward BeersDarrell Burgin Lybeck, 47 y.o. male MRN: [id number]  DOB: 05/17/1971  PRE-OPERATIVE DIAGNOSIS: Degenerative disc disease  POST-OPERATIVE DIAGNOSIS:  Same  PROCEDURE: Anterior exposure for L5-S1 disc fusion  SURGEON:  Gretta Beganodd Stephania Macfarlane, M.D.   ASSISTANT: Dr. Clotilde Dieterhris Clark  Co-surgeon for the exposure Dr. Marikay Alaravid Jones  ANESTHESIA: General  EBL: per anesthesia record  Total I/O In: 1900 [I.V.:1900] Out: 650 [Urine:550; Blood:100]  BLOOD ADMINISTERED: none  DRAINS: none  SPECIMEN: none  COUNTS CORRECT:  YES  PATIENT DISPOSITION:  PACU - hemodynamically stable  PROCEDURE DETAILS: Patient was taken to the operating placed supine position where the area of the abdomen was prepped and draped in usual sterile fashion.  C-arm was brought onto the table in the lateral projection was used to demonstrate the level of the L5-S1 disc.  Incision was made from the midline to the left and carried down through the subcutaneous fat with electrocautery.  The anterior rectus sheath was opened in line with the skin incision with electrocautery.  The rectus muscle was mobilized circumferentially.  The retroperitoneal space was opened in the left lower quadrant and the intraperitoneal contents were mobilized to the right.  The left ureter was identified and was mobilized to the right.  Dissection was continued anterior to the psoas down to the level of the L5-S1 disc.  Blunt dissection over the disc allowed exposure.  Middle sacral vessels were clipped and divided.  After adequate exposure was obtained the [facility] retractor was brought onto the field.  The reverse lip 150 blades were positioned to the right and left of the L5-S1 disc and malleable retractors were used for superior and inferior exposure.  A spinal needle was placed in the disc and C-arm was brought back onto the field to confirm that this was the appropriate level.  The remainder of the  procedure will be dictated as a separate note by Dr. Melina ModenaJones   Emilija Bohman F. Raudel Bazen, M.D., [facility] 06/06/2018 4:11 PM

## 2018-06-06 NOTE — Anesthesia Procedure Notes (Addendum)
Procedure Name: Intubation Date/Time: 06/06/2018 8:40 AM Performed by: Leonor Liv, CRNA Pre-anesthesia Checklist: Patient identified, Emergency Drugs available, Suction available and Patient being monitored Patient Re-evaluated:Patient Re-evaluated prior to induction Oxygen Delivery Method: Circle System Utilized Preoxygenation: Pre-oxygenation with 100% oxygen Induction Type: IV induction, Cricoid Pressure applied and Rapid sequence Laryngoscope Size: Mac and 4 Grade View: Grade II Tube type: Oral Tube size: 7.5 mm Number of attempts: 1 Airway Equipment and Method: Stylet and Oral airway Placement Confirmation: ETT inserted through vocal cords under direct vision,  positive ETCO2 and breath sounds checked- equal and bilateral Secured at: 22 cm Tube secured with: Tape Dental Injury: Teeth and Oropharynx as per pre-operative assessment  Comments: Pt c/o severe reflux when lying flat this AM. Head of bed elevated during induction, rapid sequence with cricoid pressure.

## 2018-06-06 NOTE — Op Note (Signed)
06/06/2018  11:34 AM  PATIENT:  Edward Good  47 y.o. 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 early  ANESTHESIA:   General  EBL: 100 ml  Total I/O In: 1600 [I.V.:1600] Out: 200 [Urine:100; Blood:100]  BLOOD ADMINISTERED: none  DRAINS: None  SPECIMEN:  none  INDICATION FOR PROCEDURE: This patient presented with severe back and left leg pain. Imaging showed lytic spondylolisthesis L5-S1. The patient tried conservative measures without relief. Pain was debilitating. Recommended anterior lumbar interbody fusion L5-S1. Patient understood the risks, benefits, and alternatives and potential outcomes and wished to proceed.  PROCEDURE DETAILS: The patient was taken to the operating room and after induction of adequate generalized instrument of anesthesia he was placed in the supine position on the operative table. His exposure was performed by Dr. Tawanna Coolerodd early of vascular surgery and this will be dictated in a separate operative report. Once the retractor was in placed a needle was placed into the midline of the L5-S1 disc space and AP and lateral fluoroscopy confirmed our level. The midline was marked above and below. As was incised and initial discectomy was done with pituitary rongeurs and curettes. I released the annulus from the endplates utilizing a Cobb elevator. Used curettes to prepare the endplates for arthrodesis. A near total discectomy was performed. I performed a left foraminotomy. Remove disc space from the foramen. I dissected until I was in the epidural space. I used a high-speed drill to prepare the endplates further for arthrodesis. We then used sequential trials and used the 13 elevator, 15 mm, and 17 mm 8  trials. The 17 mm fit best. A corresponding cage was attached to its inserter and packed with morcellized allograft. It was then gently tapped into the disc space utilizing lateral fluoroscopy. We then used 3 30 millimeter anchors, 2 into S1 and one 1 into L5. These were then locked into position on the plate. We then packed around the plate and checked our final construct with AP and lateral fluoroscopy. I then removed the retractor and found no significant bleeding. I closed the fascia with a running 0 Vicryl. I closed the subcutaneous tissues with 2-0 Vicryl and subcuticular tissue with 3-0 Vicryl and the skin with Dermabond. A final x-ray was obtained to make sure there were no retained sponges or other instruments. The patient was then awakened from general anesthesia and transported to the recovery room in stable condition. At the end of the procedure all sponge needle and instrument counts were correct according to the counts.   PLAN OF CARE: Admit to inpatient   PATIENT DISPOSITION:  PACU - hemodynamically stable.   Delay start of Pharmacological VTE agent (>24hrs) due to surgical blood loss or risk of bleeding:  yes

## 2018-06-07 ENCOUNTER — Encounter (HOSPITAL_COMMUNITY): Payer: Self-pay | Admitting: Neurological Surgery

## 2018-06-07 MED ORDER — PHENAZOPYRIDINE HCL 100 MG PO TABS
100.0000 mg | ORAL_TABLET | Freq: Three times a day (TID) | ORAL | Status: DC
  Filled 2018-06-07: qty 1

## 2018-06-07 MED ORDER — METHOCARBAMOL 500 MG PO TABS: 500.0000 mg | ORAL_TABLET | Freq: Four times a day (QID) | ORAL | 1 refills | Status: AC | PRN

## 2018-06-07 MED ORDER — OXYCODONE HCL 10 MG PO TABS: 10.0000 mg | ORAL_TABLET | Freq: Four times a day (QID) | ORAL | 0 refills | Status: AC | PRN

## 2018-06-07 MED FILL — Thrombin (Recombinant) For Soln 20000 Unit: CUTANEOUS | Qty: 1 | Status: AC

## 2018-06-07 NOTE — Progress Notes (Signed)
Physical Therapy Treatment and Discharge Patient Details Name: Edward Good MRN: [id number] DOB: 19-May-1971 Today's Date: 06/07/2018    History of Present Illness Pt is a 47 y/o male s/p L5-S1 ALIF. PMH includes asthma and MI.     PT Comments    Pt continues to make steady progress towards goals, performing transfers and ambulation with gross supervision to mod I without an AD. Pt denies dizziness today and reported he ambulated without an RN prior to session. Pt unable to don brace due to improper fit as brace does not fit around pts waistline. Per MD pt did not have to wear brace, however brace representative present at end of session to fit pt with new brace and discuss options. Educated pt on car transfers and position. Pt left in bed with all needs met and no longer requires services from PT.    Follow Up Recommendations  No PT follow up;Supervision for mobility/OOB     Equipment Recommendations  None recommended by PT    Recommendations for Other Services       Precautions / Restrictions Precautions Precautions: Back Precaution Booklet Issued: Yes (comment) Precaution Comments: Reviewed precautions with pt  Required Braces or Orthoses: Spinal Brace Spinal Brace: Lumbar corset;Applied in standing position Restrictions Weight Bearing Restrictions: No    Mobility  Bed Mobility Overal bed mobility: Needs Assistance Bed Mobility: Rolling;Sidelying to Sit;Sit to Sidelying Rolling: Supervision Sidelying to sit: Supervision     Sit to sidelying: Supervision General bed mobility comments: HOB and rails lowered; Pt requires increase time but did not needs cues for log roll sequence  Transfers Overall transfer level: Supervision Equipment used: None Transfers: Sit to/from Stand Sit to Stand: Supervision         General transfer comment: Supervision for safety as pt slow to rise,but does not require assist; pt able to power up to stand with bed height lower than  that of his at home  Ambulation/Gait Ambulation/Gait assistance: Supervision; Modified Independent   Assistive device: None Gait Pattern/deviations: Step-through pattern;Wide base of support Gait velocity: Decreased Gait velocity interpretation: 1.31 - 2.62 ft/sec, indicative of limited community ambulator General Gait Details: Slow and guarded, ambulating with min guard initially for safety progressing to supervision; denies dizziness today   Stairs             Wheelchair Mobility    Modified Rankin (Stroke Patients Only)       Balance Overall balance assessment: Mild deficits observed, not formally tested Sitting-balance support: No upper extremity supported;Feet supported Sitting balance-Leahy Scale: Good     Standing balance support: No upper extremity supported;During functional activity;Single extremity supported Standing balance-Leahy Scale: Good Standing balance comment: Able to maintain static standing without UE support.                             Cognition Arousal/Alertness: Awake/alert Behavior During Therapy: WFL for tasks assessed/performed Overall Cognitive Status: Within Functional Limits for tasks assessed                                 General Comments: Pt recalled 3/3 precautions       Exercises      General Comments        Pertinent Vitals/Pain Pain Assessment: 0-10 Pain Score: 6  Faces Pain Scale: Hurts whole lot Pain Location: back Pain Descriptors / Indicators: Sore Pain Intervention(s): Monitored  during session;Premedicated before session;Repositioned    Home Living Family/patient expects to be discharged to:: Private residence Living Arrangements: Spouse/significant other;Children Available Help at Discharge: Family;Available 24 hours/day Type of Home: House Home Access: Ramped entrance   Home Layout: One level Home Equipment: Cane - single point;Bedside commode      Prior Function Level of  Independence: Independent      Comments: works in [employer], 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   PT Goals (current goals can now be found in the care plan section) Acute Rehab PT Goals Patient Stated Goal: "to decrease pain"  PT Goal Formulation: With patient Time For Goal Achievement: 06/20/18 Potential to Achieve Goals: Good Progress towards PT goals: Goals met/education completed, patient discharged from PT    Frequency    Min 5X/week      PT Plan Current plan remains appropriate    Co-evaluation              AM-PAC PT "6 Clicks" Daily Activity  Outcome Measure  Difficulty turning over in bed (including adjusting bedclothes, sheets and blankets)?: A Little Difficulty moving from lying on back to sitting on the side of the bed? : A Little Difficulty sitting down on and standing up from a chair with arms (e.g., wheelchair, bedside commode, etc,.)?: A Little Help needed moving to and from a bed to chair (including a wheelchair)?: A Little Help needed walking in hospital room?: None Help needed climbing 3-5 steps with a railing? : A Little 6 Click Score: 19    End of Session Equipment Utilized During Treatment: Gait belt Activity Tolerance: Patient tolerated treatment well Patient left: in bed;with call bell/phone within reach Nurse Communication: Mobility status PT Visit Diagnosis: Other abnormalities of gait and mobility (R26.89);Pain Pain - part of body: (Incision site )     Time: 1245-8099 PT Time Calculation (min) (ACUTE ONLY): 16 min  Charges:  $Gait Training: 8-22 mins                     Einar Crow, [place]  Student Physical Therapist Acute Rehab [phone]    Einar Crow 06/07/2018, 12:46 PM

## 2018-06-07 NOTE — Progress Notes (Signed)
Patient is discharged from room 3C08 at this time. Alert and in stable condition. IV site d/c'[d and instructions read to patient and spouse with understanding verbalized. Left unit via wheelchair with all belongings at side. 

## 2018-06-07 NOTE — Evaluation (Signed)
Occupational Therapy Evaluation and Discharge Patient Details Name: Edward BeersDarrell Burgin Nolet MRN: [id number] DOB: 05/07/1971 Today's Date: 06/07/2018    History of Present Illness Pt is a 47 y/o male s/p L5-S1 ALIF. PMH includes asthma and MI.    Clinical Impression   Pt is functioning modified independently in self care. Verbalizing understanding of back precautions and compensatory strategies for ADL as instructed. Will have children and girlfriend available as needed to assist with IADL. No further OT needs.    Follow Up Recommendations  No OT follow up    Equipment Recommendations  None recommended by OT    Recommendations for Other Services       Precautions / Restrictions Precautions Precautions: Back Precaution Booklet Issued: Yes (comment) Precaution Comments: Reviewed precautions with pt  Required Braces or Orthoses: Spinal Brace Spinal Brace: Lumbar corset;Applied in standing position Restrictions Weight Bearing Restrictions: No      Mobility Bed Mobility  General bed mobility comments: pt standing upon entry  Transfers Overall transfer level: Modified independent     General transfer comment: slow to rise, but does not require assist    Balance     Sitting balance-Leahy Scale: Good       Standing balance-Leahy Scale: Good                             ADL either performed or assessed with clinical judgement   ADL Overall ADL's : Modified independent                                       General ADL Comments: Educated at length in ADL and IADL adhering to spinal precautions and compensatory strategies. Pt has children and girlfriend to assist with IADL as needed.     Vision Patient Visual Report: No change from baseline       Perception     Praxis      Pertinent Vitals/Pain Pain Assessment: 0-10 Pain Score: 6  Pain Location: back Pain Descriptors / Indicators: Sore Pain Intervention(s): Monitored during  session;Premedicated before session;Repositioned     Hand Dominance Right   Extremity/Trunk Assessment Upper Extremity Assessment Upper Extremity Assessment: Overall WFL for tasks assessed   Lower Extremity Assessment Lower Extremity Assessment: Defer to PT evaluation   Cervical / Trunk Assessment Cervical / Trunk Assessment: Other exceptions Cervical / Trunk Exceptions: s/p lumbar surgery    Communication Communication Communication: No difficulties   Cognition Arousal/Alertness: Awake/alert Behavior During Therapy: WFL for tasks assessed/performed Overall Cognitive Status: Within Functional Limits for tasks assessed                                 General Comments: Pt recalled 3/3 precautions    General Comments       Exercises     Shoulder Instructions      Home Living Family/patient expects to be discharged to:: Private residence Living Arrangements: Spouse/significant other;Children Available Help at Discharge: Family;Available 24 hours/day Type of Home: House Home Access: Ramped entrance     Home Layout: One level     Bathroom Shower/Tub: Producer, television/film/videoWalk-in shower   Bathroom Toilet: Standard     Home Equipment: Cane - single point;Bedside commode          Prior Functioning/Environment Level of Independence: Independent  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[employer], 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        OT Problem List: Pain      OT Treatment/Interventions:      OT Goals(Current goals can be found in the care plan section) Acute Rehab OT Goals Patient Stated Goal: "to decrease pain"   OT Frequency:     Barriers to D/C:            Co-evaluation              AM-PAC PT "6 Clicks" Daily Activity     Outcome Measure Help from another person eating meals?: None Help from another person taking care of personal grooming?: None Help from another person toileting, which includes using toliet, bedpan, or urinal?:  None Help from another person bathing (including washing, rinsing, drying)?: None Help from another person to put on and taking off regular upper body clothing?: None Help from another person to put on and taking off regular lower body clothing?: None 6 Click Score: 24   End of Session Equipment Utilized During Treatment: Gait belt;Back brace Nurse Communication: Other (comment)(ready for d/c)  Activity Tolerance: Patient tolerated treatment well Patient left: in bed;with call bell/phone within reach  OT Visit Diagnosis: Pain                Time: 1610-9604 OT Time Calculation (min): 18 min Charges:  OT General Charges $OT Visit: 1 Visit OT Evaluation $OT Eval Low Complexity: 1 Low  06/07/2018 Martie Round, OTR/L Pager: [phone]  Evern Bio 06/07/2018, 9:01 AM

## 2018-06-07 NOTE — Discharge Instructions (Signed)

## 2018-06-07 NOTE — Discharge Summary (Signed)
Physician Discharge Summary  Patient ID: Edward Good MRN: [id number] DOB/AGE: [birth year] 47 y.o.  Admit date: 06/06/2018 Discharge date: 06/07/2018  Admission Diagnoses: lytic spondy L5-S1    Discharge Diagnoses: same   Discharged Condition: good  Hospital Course: The patient was admitted on 06/06/2018 and taken to the operating room where the patient underwent ALIF L5-S1. The patient tolerated the procedure well and was taken to the recovery room and then to the floor in stable condition. The hospital course was routine. There were no complications. The wound remained clean dry and intact. Pt had appropriate back soreness. No complaints of leg pain or new N/T/W. The patient remained afebrile with stable vital signs, and tolerated a regular diet. The patient continued to increase activities, and pain was well controlled with oral pain medications.   Consults: None  Significant Diagnostic Studies:  Results for orders placed or performed during the hospital encounter of 05/29/18  Surgical pcr screen  Result Value Ref Range   MRSA, PCR NEGATIVE NEGATIVE   Staphylococcus aureus NEGATIVE NEGATIVE  Basic metabolic panel  Result Value Ref Range   Sodium 140 135 - 145 mmol/L   Potassium 4.2 3.5 - 5.1 mmol/L   Chloride 106 98 - 111 mmol/L   CO2 23 22 - 32 mmol/L   Glucose, Bld 83 70 - 99 mg/dL   BUN 13 6 - 20 mg/dL   Creatinine, Ser 4.09 (H) 0.61 - 1.24 mg/dL   Calcium 9.0 8.9 - 81.1 mg/dL   GFR calc non Af Amer >60 >60 mL/min   GFR calc Af Amer >60 >60 mL/min   Anion gap 11 5 - 15  CBC WITH DIFFERENTIAL  Result Value Ref Range   WBC 5.9 4.0 - 10.5 K/uL   RBC 5.33 4.22 - 5.81 MIL/uL   Hemoglobin 15.9 13.0 - 17.0 g/dL   HCT 91.4 78.2 - 95.6 %   MCV 87.4 78.0 - 100.0 fL   MCH 29.8 26.0 - 34.0 pg   MCHC 34.1 30.0 - 36.0 g/dL   RDW 21.3 08.6 - 57.8 %   Platelets 169 150 - 400 K/uL   Neutrophils Relative % 61 %   Neutro Abs 3.6 1.7 - 7.7 K/uL   Lymphocytes Relative 28 %    Lymphs Abs 1.7 0.7 - 4.0 K/uL   Monocytes Relative 8 %   Monocytes Absolute 0.5 0.1 - 1.0 K/uL   Eosinophils Relative 2 %   Eosinophils Absolute 0.1 0.0 - 0.7 K/uL   Basophils Relative 0 %   Basophils Absolute 0.0 0.0 - 0.1 K/uL   Immature Granulocytes 1 %   Abs Immature Granulocytes 0.0 0.0 - 0.1 K/uL  Protime-INR  Result Value Ref Range   Prothrombin Time 14.0 11.4 - 15.2 seconds   INR 1.09   Type and screen All Cardiac and thoracic surgeries, spinal fusions, myomectomies, craniotomies, colon & liver resections, total joint revisions, same day c-section with placenta previa or accreta.  Result Value Ref Range   ABO/RH(D) O NEG    Antibody Screen NEG    Sample Expiration 06/12/2018    Extend sample reason      NO TRANSFUSIONS OR PREGNANCY IN THE PAST 3 MONTHS Performed at [facility], [address] [place], [place] [address]   ABO/Rh  Result Value Ref Range   ABO/RH(D)      O NEG Performed at [facility], [address], [place], [place] [address]     Chest 2 View  Result Date: 05/29/2018 CLINICAL DATA:  Lumbar spine surgery.  Preop exam. EXAM: CHEST - 2 VIEW COMPARISON:  No prior. FINDINGS: Mediastinum and hilar structures normal. Lungs are clear. No pleural effusion pneumothorax. Heart size normal with normal pulmonary vascularity. No acute bony abnormality. Surgical clips right upper quadrant. IMPRESSION: No acute cardiopulmonary disease. Electronically Signed   By: Maisie Fus  Register   On: 05/29/2018 17:22   Dg Lumbar Spine 2-3 Views  Result Date: 06/06/2018 CLINICAL DATA:  Anterior fusion L5-S1 EXAM: DG C-ARM 61-120 MIN; LUMBAR SPINE - 2-3 VIEW COMPARISON:  None. FINDINGS: Intraoperative spot images demonstrate changes of anterior fusion at L5-S1. No visible hardware complicating feature on these intraoperative AP spot images. IMPRESSION: Anterior fusion L5-S1. Electronically Signed   By: Charlett Nose M.D.   On: 06/06/2018 11:36   Dg C-arm 1-60 Min  Result  Date: 06/06/2018 CLINICAL DATA:  Anterior fusion L5-S1 EXAM: DG C-ARM 61-120 MIN; LUMBAR SPINE - 2-3 VIEW COMPARISON:  None. FINDINGS: Intraoperative spot images demonstrate changes of anterior fusion at L5-S1. No visible hardware complicating feature on these intraoperative AP spot images. IMPRESSION: Anterior fusion L5-S1. Electronically Signed   By: Charlett Nose M.D.   On: 06/06/2018 11:36   Dg Or Local Abdomen  Result Date: 06/06/2018 CLINICAL DATA:  Postop L5-S1 ALIF. Evaluate for retained surgical instrument. EXAM: OR LOCAL ABDOMEN COMPARISON:  None. FINDINGS: 1112 hours. Single AP view demonstrates an anterior plate and screws at the L5-S1 level. There are adjacent small surgical clips. No evidence of retained foreign body or acute fracture. Mild sacroiliac degenerative changes are present. Left pelvic calcification is likely a phlebolith. IMPRESSION: No evidence of retained surgical instrument in the pelvis. These results were called by telephone at the time of interpretation on 06/06/2018 at 11:24 am to OR 19. Electronically Signed   By: Carey Bullocks M.D.   On: 06/06/2018 11:26    Antibiotics:  Anti-infectives (From admission, onward)   Start     Dose/Rate Route Frequency Ordered Stop   06/06/18 1330  ceFAZolin (ANCEF) IVPB 2g/100 mL premix     2 g 200 mL/hr over 30 Minutes Intravenous Every 8 hours 06/06/18 1329 06/06/18 2102   06/06/18 0830  bacitracin 50,000 Units in sodium chloride 0.9 % 500 mL irrigation  Status:  Discontinued       As needed 06/06/18 0956 06/06/18 1131   06/06/18 0615  ceFAZolin (ANCEF) IVPB 2g/100 mL premix     2 g 200 mL/hr over 30 Minutes Intravenous On call to O.R. 06/06/18 0612 06/06/18 0858   06/06/18 0539  ceFAZolin (ANCEF) 2-4 GM/100ML-% IVPB    Note to Pharmacy:  Lorette Ang, Meredit: cabinet override      06/06/18 0539 06/06/18 0828      Discharge Exam: Blood pressure 118/77, pulse 70, temperature 97.7 F (36.5 C), temperature source Oral, resp.  rate 20, height 5\' 8"  (1.727 m), weight 103.4 kg, SpO2 98 %. Neurologic: Grossly normal incision CDI  Discharge Medications:   Allergies as of 06/07/2018      Reactions   Codeine Itching   Cyclobenzaprine Other (See Comments)   Hallucinations    Naprosyn [naproxen]    Tears up stomach   Soap & Cleansers Itching   Tea Itching   Chlorhexidine Gluconate Rash   RASH   Latex Rash   Powder in the gloves, not latex      Medication List    TAKE these medications   acetaminophen 500 MG tablet Commonly known as:  TYLENOL  Take 1,000 mg by mouth 2 (two) times daily as needed for moderate pain.   albuterol 108 (90 Base) MCG/ACT inhaler Commonly known as:  PROVENTIL HFA;VENTOLIN HFA Inhale 2 puffs into the lungs every 6 (six) hours as needed for wheezing or shortness of breath.   cholestyramine light 4 GM/DOSE powder Commonly known as:  PREVALITE Take 4 g by mouth daily.   CLARITIN-D 12 HOUR 5-120 MG tablet Generic drug:  loratadine-pseudoephedrine Take 1 tablet by mouth daily as needed for allergies.   diclofenac sodium 1 % Gel Commonly known as:  VOLTAREN Apply 1 application topically 4 (four) times daily as needed (muscle / joint pain).   ICAPS PO Take 1 tablet by mouth daily.   latanoprost 0.005 % ophthalmic solution Commonly known as:  XALATAN Place 1 drop into both eyes at bedtime.   methocarbamol 500 MG tablet Commonly known as:  ROBAXIN Take 1 tablet (500 mg total) by mouth every 6 (six) hours as needed for muscle spasms.   omeprazole 40 MG capsule Commonly known as:  PRILOSEC Take 40 mg by mouth once daily   Oxycodone HCl 10 MG Tabs Take 1 tablet (10 mg total) by mouth every 6 (six) hours as needed for severe pain.   REFRESH OP Place 1 drop into both eyes daily as needed (burning).   rizatriptan 10 MG tablet Commonly known as:  MAXALT Take 10 mg by mouth as needed for migraine. May repeat in 2 hours if needed            Durable Medical Equipment   (From admission, onward)         Start     Ordered   06/06/18 1330  DME Walker rolling  Once    Question:  Patient needs a walker to treat with the following condition  Answer:  S/P lumbar fusion   06/06/18 1329   06/06/18 1330  DME 3 n 1  Once     06/06/18 1329          Disposition: home   Final Dx: ALIF L5-S1  Discharge Instructions    Call MD for:  difficulty breathing, headache or visual disturbances   Complete by:  As directed    Call MD for:  persistant nausea and vomiting   Complete by:  As directed    Call MD for:  redness, tenderness, or signs of infection (pain, swelling, redness, odor or green/yellow discharge around incision site)   Complete by:  As directed    Call MD for:  severe uncontrolled pain   Complete by:  As directed    Call MD for:  temperature >100.4   Complete by:  As directed    Diet - low sodium heart healthy   Complete by:  As directed    Increase activity slowly   Complete by:  As directed       Follow-up Information    Tia AlertJones, Amyra Vantuyl S, MD. Schedule an appointment as soon as possible for a visit in 2 week(s).   Specialty:  Neurosurgery Contact information: [address] [place] [address] [phone]            Signed: Tia AlertJONES,Akaya Proffit S 06/07/2018, 7:40 AM

## 2018-06-08 MED FILL — Sodium Chloride IV Soln 0.9%: INTRAVENOUS | Qty: 1000 | Status: AC

## 2018-06-08 MED FILL — Heparin Sodium (Porcine) Inj 1000 Unit/ML: INTRAMUSCULAR | Qty: 30 | Status: AC

## 2018-06-11 DIAGNOSIS — M4317 Spondylolisthesis, lumbosacral region: Secondary | ICD-10-CM

## 2018-07-10 ENCOUNTER — Other Ambulatory Visit: Payer: Self-pay

## 2018-07-10 ENCOUNTER — Encounter: Payer: Self-pay | Admitting: Family

## 2018-07-10 ENCOUNTER — Ambulatory Visit (INDEPENDENT_AMBULATORY_CARE_PROVIDER_SITE_OTHER): Payer: Self-pay | Admitting: Family

## 2018-07-10 VITALS — BP 156/98 | HR 85 | Temp 97.5°F | Resp 16 | Ht 68.0 in | Wt 234.0 lb

## 2018-07-10 DIAGNOSIS — M6208 Separation of muscle (nontraumatic), other site: Secondary | ICD-10-CM

## 2018-07-10 DIAGNOSIS — M5137 Other intervertebral disc degeneration, lumbosacral region: Secondary | ICD-10-CM

## 2018-07-10 NOTE — Progress Notes (Signed)
CC: referred by Dr. Marikay Alar to evaluate RUQ protrusion present with use of abdominal muscles  History of Present Illness  Edward Good is a 47 y.o. male whom Dr. Arbie Cookey saw on 05-03-18  to discuss anterior exposure for L5-S1 disc surgery plan for 06/06/2018 with Dr. Marikay Alar, pt did have this surgery on that date.  He reports that he slipped at work on September 2018 and has had severe pain in his back and left leg since this time.  He is failed physical therapy and injection therapy.  Has been recommended to undergo anterior approach for fusion.  He has had no prior back surgery.  He did have laparoscopic cholecystectomy and also had surgery for ruptured appendix requiring prolonged drainage.  No other intra-abdominal surgeries.  His medical history is significant for myocardial infarction with angioplasty in 2012.  Has been stable with no cardiac issues since that time   Pt returns today with c/o RUQ abdominal protuberance only present when lying back or sitting up, evaluation by Dr. Arbie Cookey requested by Dr. Yetta Barre. He also c/o left testicle pain since the surgery, states he has discussed this with Dr. Yetta Barre.  He is taking oxycodone for pain, and is also taking a stool softener and Miralax, states he is having a bowel hard bowel movement about every three days.    For VQI Use Only  PRE-ADM LIVING: Home, accompanied by friend   AMB STATUS: Ambulatory   Past Medical History:  Diagnosis Date  . Asthma    excerise induced  . Complication of anesthesia    takes a lot to go under anesthesia  . GERD (gastroesophageal reflux disease)   . Headache   . History of hiatal hernia   . History of kidney stones   . Myocardial infarction [facility])     Past Surgical History:  Procedure Laterality Date  . ABDOMINAL EXPOSURE N/A 06/06/2018   Procedure: ABDOMINAL EXPOSURE;  Surgeon: Larina Earthly, MD;  Location: [facility] OR;  Service: Vascular;  Laterality: N/A;  . ANTERIOR LUMBAR FUSION N/A  06/06/2018   Procedure: Anterior Lumbar Interbody Fusion- Lumbar five sacral one;  Surgeon: Tia Alert, MD;  Location: [facility] OR;  Service: Neurosurgery;  Laterality: N/A;  . APPENDECTOMY    . CARDIAC CATHETERIZATION  2012  . CARPAL TUNNEL RELEASE Right   . CHOLECYSTECTOMY    . KNEE ARTHROSCOPY Left   . ULNAR NERVE REPAIR Left     History reviewed. No pertinent family history.  Social History   Socioeconomic History  . Marital status: Divorced    Spouse name: Not on file  . Number of children: Not on file  . Years of education: Not on file  . Highest education level: Not on file  Occupational History  . Not on file  Social Needs  . Financial resource strain: Not on file  . Food insecurity:    Worry: Not on file    Inability: Not on file  . Transportation needs:    Medical: Not on file    Non-medical: Not on file  Tobacco Use  . Smoking status: Former Smoker    Types: Cigarettes    Last attempt to quit: 1993    Years since quitting: 26.7  . Smokeless tobacco: Former Engineer, water and Sexual Activity  . Alcohol use: Never    Frequency: Never  . Drug use: Never  . Sexual activity: Not on file  Lifestyle  . Physical activity:    Days  per week: Not on file    Minutes per session: Not on file  . Stress: Not on file  Relationships  . Social connections:    Talks on phone: Not on file    Gets together: Not on file    Attends religious service: Not on file    Active member of club or organization: Not on file    Attends meetings of clubs or organizations: Not on file    Relationship status: Not on file  . Intimate partner violence:    Fear of current or ex partner: Not on file    Emotionally abused: Not on file    Physically abused: Not on file    Forced sexual activity: Not on file  Other Topics Concern  . Not on file  Social History Narrative  . Not on file    Allergies  Allergen Reactions  . Naproxen Nausea Only    Tears up stomach  . Codeine Itching    . Cyclobenzaprine Other (See Comments)    Hallucinations   . Soap & Cleansers Itching  . Tea Itching  . Chlorhexidine Gluconate Rash    RASH  . Latex Rash    Powder in the gloves, not latex     Current Outpatient Medications on File Prior to Visit  Medication Sig Dispense Refill  . acetaminophen (TYLENOL) 500 MG tablet Take 1,000 mg by mouth 2 (two) times daily as needed for moderate pain.     Marland Kitchen albuterol (PROVENTIL HFA;VENTOLIN HFA) 108 (90 Base) MCG/ACT inhaler Inhale 2 puffs into the lungs every 6 (six) hours as needed for wheezing or shortness of breath.    Marland Kitchen amitriptyline (ELAVIL) 25 MG tablet TK 1 T PO QD  0  . cholestyramine light (PREVALITE) 4 GM/DOSE powder Take 4 g by mouth daily.    . diclofenac sodium (VOLTAREN) 1 % GEL Apply 1 application topically 4 (four) times daily as needed (muscle / joint pain).     Marland Kitchen latanoprost (XALATAN) 0.005 % ophthalmic solution Place 1 drop into both eyes at bedtime.    Marland Kitchen loratadine-pseudoephedrine (CLARITIN-D 12 HOUR) 5-120 MG tablet Take 1 tablet by mouth daily as needed for allergies.     . methocarbamol (ROBAXIN) 500 MG tablet Take 1 tablet (500 mg total) by mouth every 6 (six) hours as needed for muscle spasms. 60 tablet 1  . Multiple Vitamins-Minerals (ICAPS PO) Take 1 tablet by mouth daily.    Marland Kitchen omeprazole (PRILOSEC) 40 MG capsule Take 40 mg by mouth once daily    . oxyCODONE 10 MG TABS Take 1 tablet (10 mg total) by mouth every 6 (six) hours as needed for severe pain. 30 tablet 0  . Polyvinyl Alcohol-Povidone (REFRESH OP) Place 1 drop into both eyes daily as needed (burning).    . rizatriptan (MAXALT) 10 MG tablet Take 10 mg by mouth as needed for migraine. May repeat in 2 hours if needed     No current facility-administered medications on file prior to visit.       Physical Examination  Vitals:   07/10/18 1159 07/10/18 1205  BP: (!) 159/97 (!) 156/98  Pulse: 80 85  Resp: 16   Temp: (!) 97.5 F (36.4 C)   TempSrc: Oral    SpO2: 97%   Weight: 234 lb (106.1 kg)   Height: 5\' 8"  (1.727 m)    Body mass index is 35.58 kg/m.   PHYSICAL EXAMINATION: General: The patient appears his stated age, obese male   HEENT:  No gross abnormalities Pulmonary: Respirations are non-labored, CTAB Abdomen: Soft, moderate sized protuberance at RUQ which is only present when pt is in the process of sitting up or lying down,  tenderness to palpation at left groin Musculoskeletal: There are no major deformities.   Neurologic: No focal weakness or paresthesias are detected. Pain in his low back when assuming supine position. Skin: There are no ulcer or rashes noted. Psychiatric: The patient has normal affect. Cardiovascular: There is a regular rate and rhythm without significant murmur appreciated.   Vascular: Vessel Right Left  Radial 2+Palpable 2+Palpable  Brachial Palpable Palpable  Carotid Palpable, without bruit Palpable, without bruit  Aorta Not palpable N/A  Femoral 2+Palpable 2+Palpable  Popliteal Not palpable Not palpable  PT 2+ Palpable 2+ Palpable  DP 2+ Palpable 2+ Palpable     Other facities Studies/Documentation CT abd/pelvis w/o contrast dated 06-28-18, [facility] Impression: 1. Patient is s/p recent spinal fusion of L5 and S1 to treat spondylolysis of L5. There is a small amount of reactive tissue underneath the disc space anterior to S1. No abnormal enhancement is seen to suggest abscess and no bony destruction is identified. This probably simply represents routine postoperative changes although clinical correlation is suggested. 2. There are postoperative changes in the anterior left abdominal wall with reactive changes anterior to left rectus abdominus muscle on images 62 through 66 and additional changes further laterally on images 53 to 60. 3. Status post cholecystectomy. 4. Status post appendectomy. 5. Small bilateral fat filled inguinal hernias.  6. Mild  constipation. 7. Subserosal deposition of fat in the ileum specially terminal ileum and right colon suggestive of specific chronic inflammation.     Medical Decision Making  Kayl Avis EpleyBurgin Cosey is a 47 y.o. male who presents with diastasis of rectus abdominus. He is s/p anterior exposure for L5-S1 disc surgery on 06/06/2018 by Dr. Marikay Alaravid Jones and Dr. Karie Schwalbe. Early.   Dr. Arbie CookeyEarly spoke with pt and friend, examined pt, and viewed the CT of abdomen images on disc. No hernia found on CT, has diastasis of rectus abdominus. No treatment is indicated for this.   Follow up with us as needed.    Thank you for allowing us to participate in this patient's care.  Charisse MarchSuzanne Nickel, RN, MSN, FNP-C [facility] Office: [phone]  07/10/2018, 12:48 PM  Clinic MD: Early

## 2018-07-10 NOTE — Progress Notes (Signed)
Vitals:   07/10/18 1159  BP: (!) 159/97  Pulse: 80  Resp: 16  Temp: (!) 97.5 F (36.4 C)  TempSrc: Oral  SpO2: 97%  Weight: 234 lb (106.1 kg)  Height: 5\' 8"  (1.727 m)

## 2019-01-23 DIAGNOSIS — Z0279 Encounter for issue of other medical certificate: Secondary | ICD-10-CM | POA: Diagnosis not present

## 2020-02-07 IMAGING — RF DG LUMBAR SPINE 2-3V
1 series · 3 of 3 positions shown · non-contrast
Comparison: None.

CLINICAL DATA: Anterior fusion L5-S1

EXAM:
DG C-ARM 61-120 MIN; LUMBAR SPINE - 2-3 VIEW

[Series 1: run · 3 of 3 slices shown]
[im 1/3]
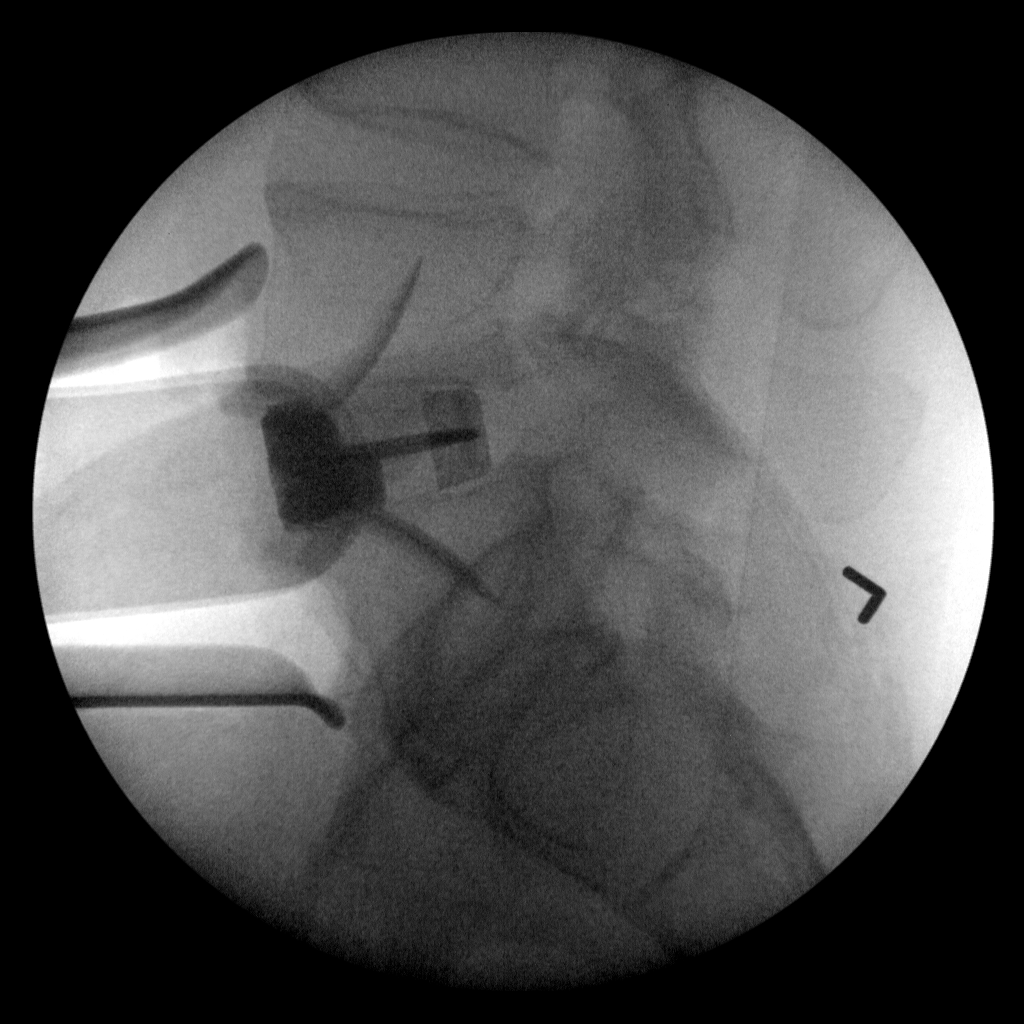
[im 2/3]
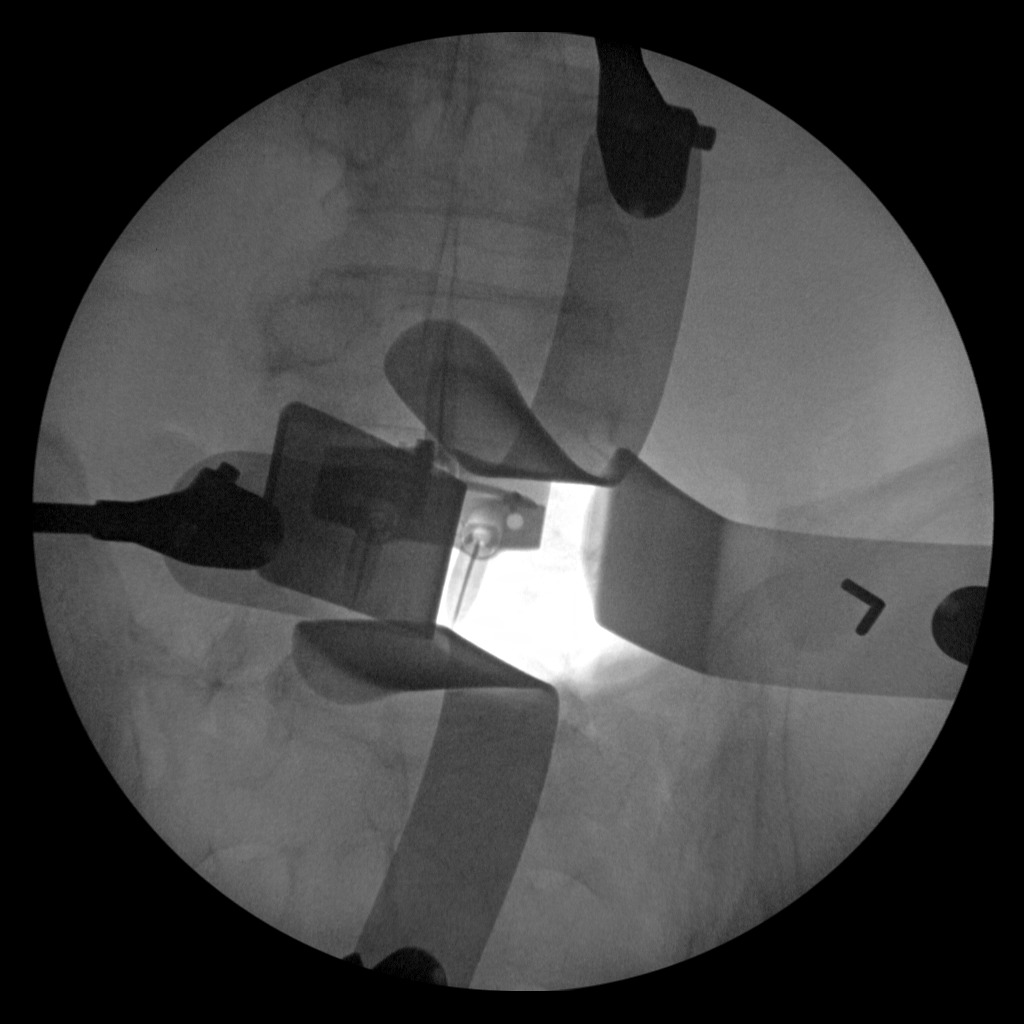
[im 3/3]
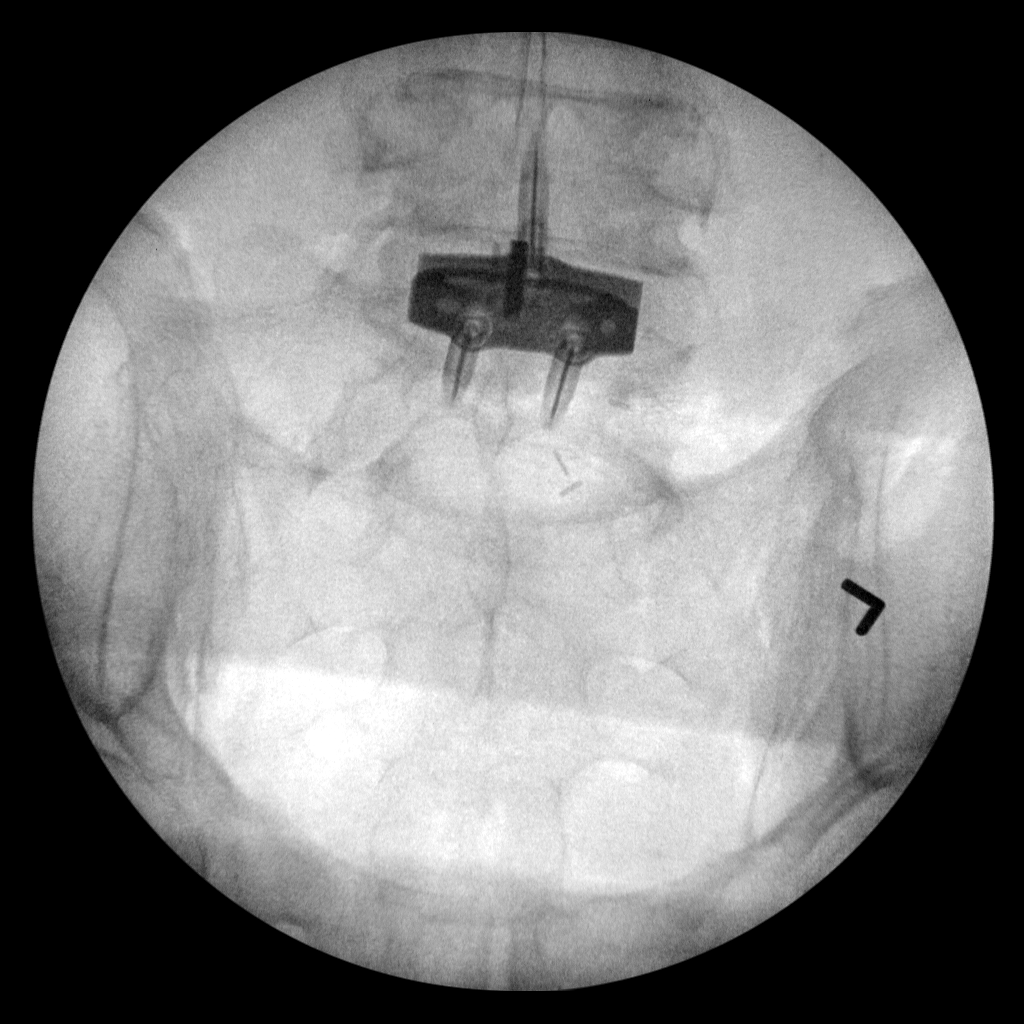

[3 of 3 positions shown; findings below may reference images not displayed]

FINDINGS: Intraoperative spot images demonstrate changes of anterior fusion at
L5-S1. No visible hardware complicating feature on these
intraoperative AP spot images.
IMPRESSION: Anterior fusion L5-S1.
